# Patient Record
Sex: Male | Born: 1970 | Race: White | Hispanic: No | Marital: Married | Smoking: Former smoker
Health system: Southern US, Community
[De-identification: ages and names within clinical notes are randomized; demographics above are authoritative.]

## PROBLEM LIST (undated history)

## (undated) DIAGNOSIS — E291 Testicular hypofunction: Secondary | ICD-10-CM

## (undated) DIAGNOSIS — E785 Hyperlipidemia, unspecified: Secondary | ICD-10-CM

## (undated) DIAGNOSIS — I10 Essential (primary) hypertension: Secondary | ICD-10-CM

## (undated) DIAGNOSIS — R319 Hematuria, unspecified: Secondary | ICD-10-CM

## (undated) DIAGNOSIS — M109 Gout, unspecified: Secondary | ICD-10-CM

## (undated) DIAGNOSIS — E559 Vitamin D deficiency, unspecified: Secondary | ICD-10-CM

## (undated) HISTORY — DX: Essential (primary) hypertension: I10

## (undated) HISTORY — DX: Hyperlipidemia, unspecified: E78.5

## (undated) HISTORY — DX: Testicular hypofunction: E29.1

## (undated) HISTORY — PX: VASECTOMY: SHX75

## (undated) HISTORY — DX: Gout, unspecified: M10.9

## (undated) HISTORY — DX: Vitamin D deficiency, unspecified: E55.9

---

## 2010-03-03 ENCOUNTER — Ambulatory Visit (HOSPITAL_COMMUNITY): Admission: RE | Admit: 2010-03-03 | Discharge: 2010-03-03 | Payer: Self-pay | Admitting: Internal Medicine

## 2013-06-13 ENCOUNTER — Encounter: Payer: Self-pay | Admitting: Internal Medicine

## 2013-06-13 ENCOUNTER — Ambulatory Visit (INDEPENDENT_AMBULATORY_CARE_PROVIDER_SITE_OTHER): Payer: BC Managed Care – PPO | Admitting: Internal Medicine

## 2013-06-13 VITALS — BP 114/78 | HR 84 | Temp 98.4°F | Resp 16 | Wt 201.2 lb

## 2013-06-13 DIAGNOSIS — Z79899 Other long term (current) drug therapy: Secondary | ICD-10-CM | POA: Insufficient documentation

## 2013-06-13 DIAGNOSIS — E782 Mixed hyperlipidemia: Secondary | ICD-10-CM

## 2013-06-13 DIAGNOSIS — Z1212 Encounter for screening for malignant neoplasm of rectum: Secondary | ICD-10-CM

## 2013-06-13 DIAGNOSIS — E559 Vitamin D deficiency, unspecified: Secondary | ICD-10-CM

## 2013-06-13 DIAGNOSIS — I10 Essential (primary) hypertension: Secondary | ICD-10-CM | POA: Insufficient documentation

## 2013-06-13 DIAGNOSIS — M545 Low back pain, unspecified: Secondary | ICD-10-CM | POA: Insufficient documentation

## 2013-06-13 DIAGNOSIS — N529 Male erectile dysfunction, unspecified: Secondary | ICD-10-CM

## 2013-06-13 LAB — CBC WITH DIFFERENTIAL/PLATELET
Basophils Relative: 0 % (ref 0–1)
Eosinophils Relative: 2 % (ref 0–5)
Hemoglobin: 15.6 g/dL (ref 13.0–17.0)
Lymphocytes Relative: 30 % (ref 12–46)
Lymphs Abs: 2 10*3/uL (ref 0.7–4.0)
MCV: 89.3 fL (ref 78.0–100.0)
Monocytes Absolute: 0.6 10*3/uL (ref 0.1–1.0)
Monocytes Relative: 10 % (ref 3–12)
Neutro Abs: 3.9 10*3/uL (ref 1.7–7.7)
Platelets: 252 10*3/uL (ref 150–400)
RBC: 4.97 MIL/uL (ref 4.22–5.81)
RDW: 14.1 % (ref 11.5–15.5)
WBC: 6.7 10*3/uL (ref 4.0–10.5)

## 2013-06-13 LAB — BASIC METABOLIC PANEL WITH GFR
BUN: 17 mg/dL (ref 6–23)
Calcium: 9.6 mg/dL (ref 8.4–10.5)
Creat: 0.86 mg/dL (ref 0.50–1.35)
GFR, Est Non African American: >89 mL/min
Glucose, Bld: 82 mg/dL (ref 70–99)
Sodium: 141 mEq/L (ref 135–145)

## 2013-06-13 LAB — HEPATIC FUNCTION PANEL
ALT: 28 U/L (ref 0–53)
AST: 21 U/L (ref 0–37)
Albumin: 4.9 g/dL (ref 3.5–5.2)
Alkaline Phosphatase: 100 U/L (ref 39–117)
Bilirubin, Direct: 0.1 mg/dL (ref 0.0–0.3)
Indirect Bilirubin: 0.3 mg/dL (ref 0.0–0.9)
Total Bilirubin: 0.4 mg/dL (ref 0.3–1.2)
Total Protein: 7.3 g/dL (ref 6.0–8.3)

## 2013-06-13 LAB — LIPID PANEL
Cholesterol: 207 mg/dL — ABNORMAL HIGH (ref 0–200)
HDL: 35 mg/dL — ABNORMAL LOW (ref >39)
LDL Cholesterol: 119 mg/dL — ABNORMAL HIGH (ref 0–99)
Total CHOL/HDL Ratio: 5.9 Ratio
Triglycerides: 266 mg/dL — ABNORMAL HIGH (ref <150)
VLDL: 53 mg/dL — ABNORMAL HIGH (ref 0–40)

## 2013-06-13 LAB — MAGNESIUM: Magnesium: 2 mg/dL (ref 1.5–2.5)

## 2013-06-13 NOTE — Patient Instructions (Signed)
Continue diet & medications same as discussed.   Further disposition pending lab results.     Hypertension As your heart beats, it forces blood through your arteries. This force is your blood pressure. If the pressure is too high, it is called hypertension (HTN) or high blood pressure. HTN is dangerous because you may have it and not know it. High blood pressure may mean that your heart has to work harder to pump blood. Your arteries may be narrow or stiff. The extra work puts you at risk for heart disease, stroke, and other problems.  Blood pressure consists of two numbers, a higher number over a lower, 110/72, for example. It is stated as "110 over 72." The ideal is below 120 for the top number (systolic) and under 80 for the bottom (diastolic). Write down your blood pressure today. You should pay close attention to your blood pressure if you have certain conditions such as:  Heart failure.  Prior heart attack.  Diabetes  Chronic kidney disease.  Prior stroke.  Multiple risk factors for heart disease. To see if you have HTN, your blood pressure should be measured while you are seated with your arm held at the level of the heart. It should be measured at least twice. A one-time elevated blood pressure reading (especially in the Emergency Department) does not mean that you need treatment. There may be conditions in which the blood pressure is different between your right and left arms. It is important to see your caregiver soon for a recheck. Most people have essential hypertension which means that there is not a specific cause. This type of high blood pressure may be lowered by changing lifestyle factors such as:  Stress.  Smoking.  Lack of exercise.  Excessive weight.  Drug/tobacco/alcohol use.  Eating less salt. Most people do not have symptoms from high blood pressure until it has caused damage to the body. Effective treatment can often prevent, delay or reduce that  damage. TREATMENT  When a cause has been identified, treatment for high blood pressure is directed at the cause. There are a large number of medications to treat HTN. These fall into several categories, and your caregiver will help you select the medicines that are best for you. Medications may have side effects. You should review side effects with your caregiver. If your blood pressure stays high after you have made lifestyle changes or started on medicines,   Your medication(s) may need to be changed.  Other problems may need to be addressed.  Be certain you understand your prescriptions, and know how and when to take your medicine.  Be sure to follow up with your caregiver within the time frame advised (usually within two weeks) to have your blood pressure rechecked and to review your medications.  If you are taking more than one medicine to lower your blood pressure, make sure you know how and at what times they should be taken. Taking two medicines at the same time can result in blood pressure that is too low. SEEK IMMEDIATE MEDICAL CARE IF:  You develop a severe headache, blurred or changing vision, or confusion.  You have unusual weakness or numbness, or a faint feeling.  You have severe chest or abdominal pain, vomiting, or breathing problems. MAKE SURE YOU:   Understand these instructions.  Will watch your condition.  Will get help right away if you are not doing well or get worse. Document Released: 06/29/2005 Document Revised: 09/21/2011 Document Reviewed: 02/17/2008 [employer] Patient Information 2014  [employer]. Cholesterol Cholesterol is a white, waxy, fat-like protein needed by your body in small amounts. The liver makes all the cholesterol you need. It is carried from the liver by the blood through the blood vessels. Deposits (plaque) may build up on blood vessel walls. This makes the arteries narrower and stiffer. Plaque increases the risk for heart attack and  stroke. You cannot feel your cholesterol level even if it is very high. The only way to know is by a blood test to check your lipid (fats) levels. Once you know your cholesterol levels, you should keep a record of the test results. Work with your caregiver to to keep your levels in the desired range. WHAT THE RESULTS MEAN:  Total cholesterol is a rough measure of all the cholesterol in your blood.  LDL is the so-called bad cholesterol. This is the type that deposits cholesterol in the walls of the arteries. You want this level to be low.  HDL is the good cholesterol because it cleans the arteries and carries the LDL away. You want this level to be high.  Triglycerides are fat that the body can either burn for energy or store. High levels are closely linked to heart disease. DESIRED LEVELS:  Total cholesterol below 200.  LDL below 100 for people at risk, below 70 for very high risk.  HDL above 50 is good, above 60 is best.  Triglycerides below 150. HOW TO LOWER YOUR CHOLESTEROL:  Diet.  Choose fish or white meat chicken and [place], roasted or baked. Limit fatty cuts of red meat, fried foods, and processed meats, such as sausage and lunch meat.  Eat lots of fresh fruits and vegetables. Choose whole grains, beans, pasta, potatoes and cereals.  Use only small amounts of olive, corn or canola oils. Avoid butter, mayonnaise, shortening or palm kernel oils. Avoid foods with trans-fats.  Use skim/nonfat milk and low-fat/nonfat yogurt and cheeses. Avoid whole milk, cream, ice cream, egg yolks and cheeses. Healthy desserts include angel food cake, ginger snaps, animal crackers, hard candy, popsicles, and low-fat/nonfat frozen yogurt. Avoid pastries, cakes, pies and cookies.  Exercise.  A regular program helps decrease LDL and raises HDL.  Helps with weight control.  Do things that increase your activity level like gardening, walking, or taking the stairs.  Medication.  May be  prescribed by your caregiver to help lowering cholesterol and the risk for heart disease.  You may need medicine even if your levels are normal if you have several risk factors. HOME CARE INSTRUCTIONS   Follow your diet and exercise programs as suggested by your caregiver.  Take medications as directed.  Have blood work done when your caregiver feels it is necessary. MAKE SURE YOU:   Understand these instructions.  Will watch your condition.  Will get help right away if you are not doing well or get worse. Document Released: 03/24/2001 Document Revised: 09/21/2011 Document Reviewed: 09/14/2007 [facility] Patient Information 2014 [place], [place]. Vitamin D Deficiency Vitamin D is an important vitamin that your body needs. Having too little of it in your body is called a deficiency. A very bad deficiency can make your bones soft and can cause a condition called rickets.  Vitamin D is important to your body for different reasons, such as:   It helps your body absorb 2 minerals called calcium and phosphorus.  It helps make your bones healthy.  It may prevent some diseases, such as diabetes and multiple sclerosis.  It helps your muscles and heart.  You can get vitamin D in several ways. It is a natural part of some foods. The vitamin is also added to some dairy products and cereals. Some people take vitamin D supplements. Also, your body makes vitamin D when you are in the sun. It changes the sun's rays into a form of the vitamin that your body can use. CAUSES   Not eating enough foods that contain vitamin D.  Not getting enough sunlight.  Having certain digestive system diseases that make it hard to absorb vitamin D. These diseases include Crohn's disease, chronic pancreatitis, and cystic fibrosis.  Having a surgery in which part of the stomach or small intestine is removed.  Being obese. Fat cells pull vitamin D out of your blood. That means that obese people may not have enough  vitamin D left in their blood and in other body tissues.  Having chronic kidney or liver disease. RISK FACTORS Risk factors are things that make you more likely to develop a vitamin D deficiency. They include:  Being older.  Not being able to get outside very much.  Living in a nursing home.  Having had broken bones.  Having weak or thin bones (osteoporosis).  Having a disease or condition that changes how your body absorbs vitamin D.  Having dark skin.  Some medicines such as seizure medicines or steroids.  Being overweight or obese. SYMPTOMS Mild cases of vitamin D deficiency may not have any symptoms. If you have a very bad case, symptoms may include:  Bone pain.  Muscle pain.  Falling often.  Broken bones caused by a minor injury, due to osteoporosis. DIAGNOSIS A blood test is the best way to tell if you have a vitamin D deficiency. TREATMENT Vitamin D deficiency can be treated in different ways. Treatment for vitamin D deficiency depends on what is causing it. Options include:  Taking vitamin D supplements.  Taking a calcium supplement. Your caregiver will suggest what dose is best for you. HOME CARE INSTRUCTIONS  Take any supplements that your caregiver prescribes. Follow the directions carefully. Take only the suggested amount.  Have your blood tested 2 months after you start taking supplements.  Eat foods that contain vitamin D. Healthy choices include:  Fortified dairy products, cereals, or juices. Fortified means vitamin D has been added to the food. Check the label on the package to be sure.  Fatty fish like salmon or trout.  Eggs.  Oysters.  Do not use a tanning bed.  Keep your weight at a healthy level. Lose weight if you need to.  Keep all follow-up appointments. Your caregiver will need to perform blood tests to make sure your vitamin D deficiency is going away. SEEK MEDICAL CARE IF:  You have any questions about your treatment.  You  continue to have symptoms of vitamin D deficiency.  You have nausea or vomiting.  You are constipated.  You feel confused.  You have severe abdominal or back pain. MAKE SURE YOU:  Understand these instructions.  Will watch your condition.  Will get help right away if you are not doing well or get worse. Document Released: 09/21/2011 Document Revised: 10/24/2012 Document Reviewed: 09/21/2011 [facility] Patient Information 2014 [place], [place].

## 2013-06-13 NOTE — Progress Notes (Signed)
Patient ID: Jason Curry, male   DOB: 17-Jun-1971, 42 y.o.   MRN: [id number]   This very nice 42 yo MWM presents for 3 month follow up with Hypertension, Hyperlipidemia, Testosterone Deficiency and Vitamin D deficiency.    BP has been controlled at home. Today's BP is 114/78. Patient denies any cardiac type chest pain, palpitations, dyspnea/orthopnea/PND, dizziness, claudication, or dependent edema.   Hyperlipidemia is controlled with diet & Atorvastatin. Last cholesterol was  160, Triglycerides were 148, HDL 35 and LDL 98. Patient denies myalgias or other med SE's.    Also, the patient has history of testosterone deficiency with last testosterone level 315 - low normal in August. He denies any issues with undue fatigue, decreased libido or ED issues.   Further, Patient has history of vitamin D deficiency with last vitamin D of 52 in August. Patient supplements vitamin without any suspected side-effects. He had normal A1c of 5.0% in August.     Medication List       atorvastatin 80 MG tablet  Commonly known as:  LIPITOR  Take 80 mg by mouth every other day.     cholecalciferol 1000 UNITS tablet  Commonly known as:  VITAMIN D  Take 8,000 Units by mouth daily.     Magnesium 250 MG Tabs  Take by mouth.     vitamin C 1000 MG tablet  Take 1,000 mg by mouth daily.         No Known Allergies  PMHx:   Past Medical History  Diagnosis Date  . Hypertension   . Hyperlipidemia    Vitamin D Deficiency   . Hypogonadism male     FHx:    Reviewed / unchanged  SHx:    Reviewed / unchanged  Systems Review: Constitutional: Denies fever, chills, wt changes, headaches, insomnia, fatigue, night sweats, change in appetite. Eyes: Denies redness, blurred vision, diplopia, discharge, itchy, watery eyes.  ENT: Denies discharge, congestion, post nasal drip, epistaxis, sore throat, earache, hearing loss, dental pain, tinnitus, vertigo, sinus pain, snoring.  CV: Denies chest pain, palpitations,  irregular heartbeat, syncope, dyspnea, diaphoresis, orthopnea, PND, claudication, edema. Respiratory: denies cough, dyspnea, DOE, pleurisy, hoarseness, laryngitis, wheezing.  Gastrointestinal: Denies dysphagia, odynophagia, heartburn, reflux, water brash, abdominal pain or cramps, nausea, vomiting, bloating, diarrhea, constipation, hematemesis, melena, hematochezia,  Hemorrhoids. Genitourinary: Denies dysuria, frequency, urgency, nocturia, hesitancy, discharge, hematuria, flank pain. Musculoskeletal: Denies arthralgias, myalgias, stiffness, jt. swelling, pain, limp, strain/sprain.  Skin: Denies pruritus, rash, hives, warts, acne, eczema, change in skin lesion(s). Neuro: No weakness, tremor, incoordination, spasms, paresthesia, or pain. Psychiatric: Denies confusion, memory loss, or sensory loss. Endo: Denies change in weight, skin, hair change.  Heme/Lymph: No excessive bleeding, bruising, orenlarged lymph nodes.  Filed Vitals:   06/13/13 1627  BP: 114/78  Pulse: 84  Temp: 98.4 F (36.9 C)  Resp: 16     On Exam: Appears well nourished - in no distress. Eyes: PERRLA, EOMs, conjunctiva no swelling or erythema. Sinuses: No frontal/maxillary tenderness ENT/Mouth: EAC's clear, TM's nl w/o erythema, bulging. Nares clear w/o erythema, swelling, exudates. Oropharynx clear without erythema or exudates. Oral hygiene is good. Tongue normal, non obstructing. Hearing intact.  Neck: Supple. Thyroid nl. Car 2+/2+ without bruits, nodes or JVD. Chest: Respirations nl with BS clear & equal w/o rales, rhonchi, wheezing or stridor.  Cor: Heart sounds normal w/ regular rate and rhythm without sig. murmurs, gallops, clicks, or rubs. Peripheral pulses normal and equal  without edema.  Abdomen: Soft & bowel sounds normal. Non-tender  w/o guarding, rebound, hernias, masses, or organomegaly.  Lymphatics: Unremarkable.  Musculoskeletal: Full ROM all peripheral extremities, joint stability, 5/5 strength, and  normal gait.  Skin: Warm, dry without exposed rashes, lesions, ecchymosis apparent.  Neuro: Cranial nerves intact, reflexes equal bilaterally. Sensory-motor testing grossly intact. Tendon reflexes grossly intact.  Pysch: Alert & oriented x 3. Insight and judgement nl & appropriate. No ideations.  Assessment and Plan:  1. Hypertension - Continue monitor blood pressure at home. Continue diet/meds same.  2. Hyperlipidemia - Continue diet/meds, exercise,& lifestyle modifications. Continue monitor periodic cholesterol/liver & renal functions   3. Testosterone Deficiency, Hx/o.  4. Vitamin D Deficiency - Continue supplementation.  Further disposition pending results of labs.

## 2013-06-14 LAB — VITAMIN D 25 HYDROXY (VIT D DEFICIENCY, FRACTURES): Vit D, 25-Hydroxy: 78 ng/mL (ref 30–89)

## 2013-09-14 ENCOUNTER — Ambulatory Visit (INDEPENDENT_AMBULATORY_CARE_PROVIDER_SITE_OTHER): Payer: BC Managed Care – PPO | Admitting: Physician Assistant

## 2013-09-14 ENCOUNTER — Encounter: Payer: Self-pay | Admitting: Physician Assistant

## 2013-09-14 VITALS — BP 110/68 | HR 72 | Temp 99.0°F | Resp 16 | Wt 197.0 lb

## 2013-09-14 DIAGNOSIS — Z79899 Other long term (current) drug therapy: Secondary | ICD-10-CM

## 2013-09-14 DIAGNOSIS — E559 Vitamin D deficiency, unspecified: Secondary | ICD-10-CM

## 2013-09-14 DIAGNOSIS — I10 Essential (primary) hypertension: Secondary | ICD-10-CM

## 2013-09-14 DIAGNOSIS — E782 Mixed hyperlipidemia: Secondary | ICD-10-CM

## 2013-09-14 LAB — CBC WITH DIFFERENTIAL/PLATELET
BASOS PCT: 0 % (ref 0–1)
Basophils Absolute: 0 10*3/uL (ref 0.0–0.1)
EOS PCT: 2 % (ref 0–5)
Eosinophils Absolute: 0.1 10*3/uL (ref 0.0–0.7)
HCT: 45.8 % (ref 39.0–52.0)
HEMOGLOBIN: 16.1 g/dL (ref 13.0–17.0)
LYMPHS PCT: 19 % (ref 12–46)
Lymphs Abs: 1.3 10*3/uL (ref 0.7–4.0)
MCH: 32.1 pg (ref 26.0–34.0)
MCHC: 35.2 g/dL (ref 30.0–36.0)
MCV: 91.2 fL (ref 78.0–100.0)
MONOS PCT: 10 % (ref 3–12)
Monocytes Absolute: 0.7 10*3/uL (ref 0.1–1.0)
NEUTROS ABS: 4.6 10*3/uL (ref 1.7–7.7)
NEUTROS PCT: 69 % (ref 43–77)
Platelets: 222 10*3/uL (ref 150–400)
RBC: 5.02 MIL/uL (ref 4.22–5.81)
RDW: 14.2 % (ref 11.5–15.5)
WBC: 6.6 10*3/uL (ref 4.0–10.5)

## 2013-09-14 NOTE — Patient Instructions (Signed)
Cholesterol Cholesterol is a white, waxy, fat-like protein needed by your body in small amounts. The liver makes all the cholesterol you need. It is carried from the liver by the blood through the blood vessels. Deposits (plaque) may build up on blood vessel walls. This makes the arteries narrower and stiffer. Plaque increases the risk for heart attack and stroke. You cannot feel your cholesterol level even if it is very high. The only way to know is by a blood test to check your lipid (fats) levels. Once you know your cholesterol levels, you should keep a record of the test results. Work with your caregiver to to keep your levels in the desired range. WHAT THE RESULTS MEAN:  Total cholesterol is a rough measure of all the cholesterol in your blood.  LDL is the so-called bad cholesterol. This is the type that deposits cholesterol in the walls of the arteries. You want this level to be low.  HDL is the good cholesterol because it cleans the arteries and carries the LDL away. You want this level to be high.  Triglycerides are fat that the body can either burn for energy or store. High levels are closely linked to heart disease. DESIRED LEVELS:  Total cholesterol below 200.  LDL below 100 for people at risk, below 70 for very high risk.  HDL above 50 is good, above 60 is best.  Triglycerides below 150. HOW TO LOWER YOUR CHOLESTEROL:  Diet.  Choose fish or white meat chicken and [place], roasted or baked. Limit fatty cuts of red meat, fried foods, and processed meats, such as sausage and lunch meat.  Eat lots of fresh fruits and vegetables. Choose whole grains, beans, pasta, potatoes and cereals.  Use only small amounts of olive, corn or canola oils. Avoid butter, mayonnaise, shortening or palm kernel oils. Avoid foods with trans-fats.  Use skim/nonfat milk and low-fat/nonfat yogurt and cheeses. Avoid whole milk, cream, ice cream, egg yolks and cheeses. Healthy desserts include angel food  cake, ginger snaps, animal crackers, hard candy, popsicles, and low-fat/nonfat frozen yogurt. Avoid pastries, cakes, pies and cookies.  Exercise.  A regular program helps decrease LDL and raises HDL.  Helps with weight control.  Do things that increase your activity level like gardening, walking, or taking the stairs.  Medication.  May be prescribed by your caregiver to help lowering cholesterol and the risk for heart disease.  You may need medicine even if your levels are normal if you have several risk factors. HOME CARE INSTRUCTIONS   Follow your diet and exercise programs as suggested by your caregiver.  Take medications as directed.  Have blood work done when your caregiver feels it is necessary. MAKE SURE YOU:   Understand these instructions.  Will watch your condition.  Will get help right away if you are not doing well or get worse. Document Released: 03/24/2001 Document Revised: 09/21/2011 Document Reviewed: 04/12/2013 [facility] Patient Information 2014 [place].  Your HDL is low. The goal is greater than 40 for men and greater than 50 for women.  H-density lipoprotein cholesterol is considered to be beneficial because it removes excess cholesterol and disposes of it. Hence HDL cholesterol is often termed "good" cholesterol. Ways to increase your HDL is to add more veggies and walk more. The American Heart Association says that 150 mins a week of walking or cardio is good for your heart and will improve your HDL.  Triglycerides are simple fats in blood that are converted into a storage form.  I recommend you avoid fried/greasy foods, sweets/candy, white rice , white potatoes,  anything made from white flour, sweet tea, soda, fruit juices and avoid alcohol in excess. Sweet potatoes, brown/wild rice/Quinoa, Vegetarian, spinach, or wheat pasta, Multi-grain bread - like multi-grain flat bread or sandwich thins are okay.  Please consider getting a pedometer and tracking  steps while at work. Goal is 10,000 steps at least in a day.

## 2013-09-14 NOTE — Progress Notes (Signed)
HPI 43 y.o. male  presents for 3 month follow up with hypertension, hyperlipidemia, prediabetes and vitamin D. His blood pressure has been controlled at home, today their BP is BP: 110/68 mmHg He does not workout but he works 12 hour shifts in [employer] and states very active. He denies chest pain, shortness of breath, dizziness.  He is on cholesterol medication, he ran out and just started to take it regularly again 1-2 weeks ago, and denies myalgias. His cholesterol is at goal. The cholesterol last visit was:   Lab Results  Component Value Date   CHOL 207* 06/13/2013   HDL 35* 06/13/2013   LDLCALC 119* 06/13/2013   TRIG 266* 06/13/2013   CHOLHDL 5.9 06/13/2013  A1C 5.0, was treated for hypogonadism but stopped and states that he is feeling better.   Current Medications:  Current Outpatient Prescriptions on File Prior to Visit  Medication Sig Dispense Refill  . Ascorbic Acid (VITAMIN C) 1000 MG tablet Take 1,000 mg by mouth daily.      Marland Kitchen. atorvastatin (LIPITOR) 80 MG tablet Take 80 mg by mouth every other day.      . cholecalciferol (VITAMIN D) 1000 UNITS tablet Take 8,000 Units by mouth daily.      . Magnesium 250 MG TABS Take by mouth.       No current facility-administered medications on file prior to visit.   Medical History:  Past Medical History  Diagnosis Date  . Hypertension   . Hyperlipidemia   . Hypogonadism male    Allergies: No Known Allergies   Review of Systems: [X]  = complains of  [ ]  = denies  General: Fatigue [ ]  Fever [ ]  Chills [ ]  Weakness [ ]   Insomnia [ ]  Eyes: Redness [ ]  Blurred vision [ ]  Diplopia [ ]   ENT: Congestion [ ]  Sinus Pain [ ]  Post Nasal Drip [ ]  Sore Throat [ ]  Earache [ ]   Cardiac: Chest pain/pressure [ ]  SOB [ ]  Orthopnea [ ]   Palpitations [ ]   Paroxysmal nocturnal dyspnea[ ]  Claudication [ ]  Edema [ ]   Pulmonary: Cough [ ]  Wheezing[ ]   SOB [ ]   Snoring [ ]   GI: Nausea [ ]  Vomiting[ ]  Dysphagia[ ]  Heartburn[ ]  Abdominal pain [ ]  Constipation [  ]; Diarrhea [ ] ; BRBPR [ ]  Melena[ ]  GU: Hematuria[ ]  Dysuria [ ]  Nocturia[ ]  Urgency [ ]   Hesitancy [ ]  Discharge [ ]  Neuro: Headaches[ ]  Vertigo[ ]  Paresthesias[ ]  Spasm [ ]  Speech changes [ ]  Incoordination [ ]   Ortho: Arthritis [ ]  Joint pain [ ]  Muscle pain [ ]  Joint swelling [ ]  Back Pain [ ]  Skin:  Rash [ ]   Pruritis [ ]  Change in skin lesion [ ]   Psych: Depression[ ]  Anxiety[ ]  Confusion [ ]  Memory loss [ ]   Heme/Lypmh: Bleeding [ ]  Bruising [ ]  Enlarged lymph nodes [ ]   Endocrine: Visual blurring [ ]  Paresthesia [ ]  Polyuria [ ]  Polydypsea [ ]    Heat/cold intolerance [ ]  Hypoglycemia [ ]   Family history- Review and unchanged Social history- Review and unchanged Physical Exam: Filed Vitals:   09/14/13 1548  BP: 110/68  Pulse: 72  Temp: 99 F (37.2 C)  Resp: 16   Wt Readings from Last 3 Encounters:  09/14/13 197 lb (89.359 kg)  06/13/13 201 lb 3.2 oz (91.264 kg)   General Appearance: Well nourished, in no apparent distress. Eyes: PERRLA, EOMs, conjunctiva no swelling or erythema Sinuses: No Frontal/maxillary tenderness ENT/Mouth:  Ext aud canals clear, TMs without erythema, bulging. No erythema, swelling, or exudate on post pharynx.  Tonsils not swollen or erythematous. Hearing normal.  Neck: Supple, thyroid normal.  Respiratory: Respiratory effort normal, BS equal bilaterally without rales, rhonchi, wheezing or stridor.  Cardio: RRR with no MRGs. Brisk peripheral pulses without edema.  Abdomen: Soft, + BS.  Non tender, no guarding, rebound, hernias, masses. Lymphatics: Non tender without lymphadenopathy.  Musculoskeletal: Full ROM, 5/5 strength, normal gait.  Skin: Warm, dry without rashes, lesions, ecchymosis.  Neuro: Cranial nerves intact. Normal muscle tone, no cerebellar symptoms. Sensation intact.  Psych: Awake and oriented X 3, normal affect, Insight and Judgment appropriate.   Assessment and Plan:  Hypertension: Continue medication, monitor blood pressure at  home. Continue DASH diet. Cholesterol: Continue diet and exercise. Check cholesterol.  Vitamin D Def- check level and continue medications.  Hypogonadism- no symptoms will not check  Continue diet and meds as discussed. Further disposition pending results of labs.  Quentin Mulling 4:00 PM

## 2013-09-15 LAB — HEPATIC FUNCTION PANEL
ALK PHOS: 100 U/L (ref 39–117)
ALT: 24 U/L (ref 0–53)
AST: 21 U/L (ref 0–37)
Albumin: 4.9 g/dL (ref 3.5–5.2)
BILIRUBIN DIRECT: 0.2 mg/dL (ref 0.0–0.3)
BILIRUBIN TOTAL: 0.8 mg/dL (ref 0.2–1.2)
Indirect Bilirubin: 0.6 mg/dL (ref 0.2–1.2)
Total Protein: 7.4 g/dL (ref 6.0–8.3)

## 2013-09-15 LAB — BASIC METABOLIC PANEL WITH GFR
BUN: 14 mg/dL (ref 6–23)
CO2: 28 mEq/L (ref 19–32)
CREATININE: 0.91 mg/dL (ref 0.50–1.35)
Calcium: 9.8 mg/dL (ref 8.4–10.5)
Chloride: 101 mEq/L (ref 96–112)
GFR, Est African American: >89 mL/min
GFR, Est Non African American: >89 mL/min
GLUCOSE: 77 mg/dL (ref 70–99)
POTASSIUM: 4.9 meq/L (ref 3.5–5.3)
Sodium: 140 mEq/L (ref 135–145)

## 2013-09-15 LAB — VITAMIN D 25 HYDROXY (VIT D DEFICIENCY, FRACTURES): VIT D 25 HYDROXY: 80 ng/mL (ref 30–89)

## 2013-09-15 LAB — TSH: TSH: 2.617 u[IU]/mL (ref 0.350–4.500)

## 2013-09-15 LAB — LIPID PANEL
Cholesterol: 179 mg/dL (ref 0–200)
HDL: 35 mg/dL — AB (ref >39)
LDL CALC: 124 mg/dL — AB (ref 0–99)
Total CHOL/HDL Ratio: 5.1 Ratio
Triglycerides: 102 mg/dL (ref <150)
VLDL: 20 mg/dL (ref 0–40)

## 2013-09-15 LAB — MAGNESIUM: MAGNESIUM: 2 mg/dL (ref 1.5–2.5)

## 2013-12-19 ENCOUNTER — Ambulatory Visit: Payer: Self-pay | Admitting: Physician Assistant

## 2014-03-13 ENCOUNTER — Encounter: Payer: Self-pay | Admitting: Emergency Medicine

## 2014-04-05 ENCOUNTER — Ambulatory Visit (INDEPENDENT_AMBULATORY_CARE_PROVIDER_SITE_OTHER): Payer: BC Managed Care – PPO | Admitting: Emergency Medicine

## 2014-04-05 ENCOUNTER — Encounter: Payer: Self-pay | Admitting: Emergency Medicine

## 2014-04-05 ENCOUNTER — Other Ambulatory Visit: Payer: Self-pay | Admitting: Emergency Medicine

## 2014-04-05 VITALS — BP 122/88 | HR 82 | Temp 98.2°F | Resp 18 | Ht 66.0 in | Wt 199.6 lb

## 2014-04-05 DIAGNOSIS — Z1212 Encounter for screening for malignant neoplasm of rectum: Secondary | ICD-10-CM

## 2014-04-05 DIAGNOSIS — Z111 Encounter for screening for respiratory tuberculosis: Secondary | ICD-10-CM

## 2014-04-05 DIAGNOSIS — R5383 Other fatigue: Secondary | ICD-10-CM

## 2014-04-05 DIAGNOSIS — Z125 Encounter for screening for malignant neoplasm of prostate: Secondary | ICD-10-CM

## 2014-04-05 DIAGNOSIS — R5381 Other malaise: Secondary | ICD-10-CM

## 2014-04-05 DIAGNOSIS — R0602 Shortness of breath: Secondary | ICD-10-CM

## 2014-04-05 DIAGNOSIS — E782 Mixed hyperlipidemia: Secondary | ICD-10-CM

## 2014-04-05 DIAGNOSIS — Z0000 Encounter for general adult medical examination without abnormal findings: Secondary | ICD-10-CM

## 2014-04-05 NOTE — Patient Instructions (Signed)
Fatigue Fatigue is a feeling of tiredness, lack of energy, lack of motivation, or feeling tired all the time. Having enough rest, good nutrition, and reducing stress will normally reduce fatigue. Consult your caregiver if it persists. The nature of your fatigue will help your caregiver to find out its cause. The treatment is based on the cause.  CAUSES  There are many causes for fatigue. Most of the time, fatigue can be traced to one or more of your habits or routines. Most causes fit into one or more of three general areas. They are: Lifestyle problems  Sleep disturbances.  Overwork.  Physical exertion.  Unhealthy habits.  Poor eating habits or eating disorders.  Alcohol and/or drug use .  Lack of proper nutrition (malnutrition). Psychological problems  Stress and/or anxiety problems.  Depression.  Grief.  Boredom. Medical Problems or Conditions  Anemia.  Pregnancy.  Thyroid gland problems.  Recovery from major surgery.  Continuous pain.  Emphysema or asthma that is not well controlled  Allergic conditions.  Diabetes.  Infections (such as mononucleosis).  Obesity.  Sleep disorders, such as sleep apnea.  Heart failure or other heart-related problems.  Cancer.  Kidney disease.  Liver disease.  Effects of certain medicines such as antihistamines, cough and cold remedies, prescription pain medicines, heart and blood pressure medicines, drugs used for treatment of cancer, and some antidepressants. SYMPTOMS  The symptoms of fatigue include:   Lack of energy.  Lack of drive (motivation).  Drowsiness.  Feeling of indifference to the surroundings. DIAGNOSIS  The details of how you feel help guide your caregiver in finding out what is causing the fatigue. You will be asked about your present and past health condition. It is important to review all medicines that you take, including prescription and non-prescription items. A thorough exam will be done.  You will be questioned about your feelings, habits, and normal lifestyle. Your caregiver may suggest blood tests, urine tests, or other tests to look for common medical causes of fatigue.  TREATMENT  Fatigue is treated by correcting the underlying cause. For example, if you have continuous pain or depression, treating these causes will improve how you feel. Similarly, adjusting the dose of certain medicines will help in reducing fatigue.  HOME CARE INSTRUCTIONS   Try to get the required amount of good sleep every night.  Eat a healthy and nutritious diet, and drink enough water throughout the day.  Practice ways of relaxing (including yoga or meditation).  Exercise regularly.  Make plans to change situations that cause stress. Act on those plans so that stresses decrease over time. Keep your work and personal routine reasonable.  Avoid street drugs and minimize use of alcohol.  Start taking a daily multivitamin after consulting your caregiver. SEEK MEDICAL CARE IF:   You have persistent tiredness, which cannot be accounted for.  You have fever.  You have unintentional weight loss.  You have headaches.  You have disturbed sleep throughout the night.  You are feeling sad.  You have constipation.  You have dry skin.  You have gained weight.  You are taking any new or different medicines that you suspect are causing fatigue.  You are unable to sleep at night.  You develop any unusual swelling of your legs or other parts of your body. SEEK IMMEDIATE MEDICAL CARE IF:   You are feeling confused.  Your vision is blurred.  You feel faint or pass out.  You develop severe headache.  You develop severe abdominal, pelvic, or   back pain.  You develop chest pain, shortness of breath, or an irregular or fast heartbeat.  You are unable to pass a normal amount of urine.  You develop abnormal bleeding such as bleeding from the rectum or you vomit blood.  You have thoughts  about harming yourself or committing suicide.  You are worried that you might harm someone else. MAKE SURE YOU:   Understand these instructions.  Will watch your condition.  Will get help right away if you are not doing well or get worse. Document Released: 04/26/2007 Document Revised: 09/21/2011 Document Reviewed: 10/31/2013 [facility] Patient Information 2015 [place], [place]. This information is not intended to replace advice given to you by your health care provider. Make sure you discuss any questions you have with your health care provider. Gluten-Free Diet for Celiac Disease Gluten is a protein found in wheat, rye, barley, and triticale (a cross between wheat and rye) grains. People with celiac disease need to have a gluten-free diet. With celiac disease, gluten interferes with the absorption of food and may also cause intestinal injury.  Strict compliance is important even during symptom-free periods. This means eliminating all foods with gluten from your diet permanently. This requires some significant changes but is very manageable. WHAT DO I NEED TO KNOW ABOUT A GLUTEN-FREE DIET?  Look for items labeled with "GF." Looking for GF will make it easier to identify products that are safe to eat.  Read all labels. Gluten may have been added as a minor ingredient where least expected, such as in shredded cheeses or ice creams. Always check food labels and investigate questionable ingredients. Talk to your dietitian or health care provider if you have questions about certain foods or need help finding GF foods.  Check when in doubt. If you are not sure whether an ingredient contains gluten, check with the manufacturer. Note that some manufacturers may change ingredients without notice. Always read labels.   Know how food is prepared. Since flour and cereal products are often used in the preparation of foods, it is important to be aware of the methods of preparation used, as well as the  ingredients in the foods themselves. This is especially true when you are dining out. Ask restaurants if they have a gluten-free menu.  Watch for cross-contamination. Cross-contamination occurs when gluten-free foods come into contact with foods that contain gluten. It often happens during the manufacturing process. Always check the ingredient list and for warnings on packages, such as "may contain gluten."  Eat a balanced diet. It is important to still get enough fiber, iron, and B vitamins in your diet. Look for enriched whole grain gluten-free products and continue to eat a well-balanced diet of the important non-grain items, such as vegetables, fruit, lean proteins, legumes, and dairy.  Consider taking a gluten-free multivitamin and mineral supplement. Discuss this with your health care provider. WHAT KEY WORDS HELP IDENTIFY GLUTEN? Know key words to help identify gluten. A dietitian can help you identify possible harmful ingredients in the foods you normally eat. Words to check for on food labels include:   Flour, enriched flour, bromated flour, white flour, durum flour, graham flour, phosphated flour, self-rising flour, semolina, or farina.  Starch, dextrin, modified food starch, or cereal.  Thickening, fillers, or emulsifiers.  Any kind of malt flavoring, extract, or syrup (malt is made from barley and includes malt vinegar, malted milk, and malted beverages).  Hydrolyzed vegetable protein. WHAT FOODS CAN I EAT? Below is a list of common foods that  are allowed with a gluten-free diet.  Grains Products made from the following flours or grains:amaranth,bean flours, 100% buckwheat flour, corn, millet, nut flours or meals, GF oats, quinoa, rice, sorghum, teff, any all-purpose 100% GF flour mix, rice wafers, pure cornmeal tortillas, popcorn, some crackers, some chips, and hot cereals made from cornmeal. Ask your dietitian which specific hot and cold cereals are allowed. Hominy, rice or wild  rice, and special GF pasta. Some Asian rice noodles or bean noodles. Arrowroot starch, corn bran, corn flour, corn germ, cornmeal, corn starch, potato flour, potato starch flour, and rice bran. Rice flours: plain, brown, and sweet. Rice polish, soy flour, tapioca starch. Vegetables All plain, fresh, frozen, or canned vegetables.  Fruits All fresh, frozen, canned, dried fruits, and fruit juices.  Meats and Other Protein Foods Meat, fish, poultry, or eggs prepared without added wheat, rye, barley, or triticale. Some luncheon meat and some frankfurters. Pure meat. All aged cheese, most processed cheese products, some cottage cheese, and some cream cheese. Dried beans, dried peas, and lentils.  Dairy Milk and yogurt made with allowed ingredients.  Beverages Coffee (regular or decaffeinated), tea, herbal tea (read label to be sure that no wheat flour has been added). Carbonated beverages and some root beers. Wine, sake, and distilled spirits, such as gin, vodka, and whiskey. GF beers and GF ciders.  Sweetsand Desserts Sugar, honey, some syrups, molasses, jelly, jam, plain hard candy, marshmallows, gumdrops, homemade candies free of wheat, rye, barley, or triticale. Coconut. Custard, some pudding mixes, and homemade puddings from cornstarch, rice, and tapioca. Gelatin desserts, sorbets, frozen ice pops, and sherbet. Cake, cookies, and other desserts prepared with allowed flours. Some commercial ice creams. Ask your dietitian about specific brands of dessert that are allowed.  Fats and Oils Butter, margarine, vegetable oil, sour cream not containing modified food starch, whipping cream, shortening, lard, cream, and some mayonnaise. Some commercial salad dressings. Peanut butter.  Other Homemade broth and soups made with allowed ingredients; some canned or frozen soups. Any other combination or prepared foods that do not contain gluten. Monosodium glutamate (MSG). Cider, rice, and wine vinegar.  Baking soda and baking powder. Certain soy sauces (Tamari). Ask your dietitian about specific brands that are allowed. Nuts, coconut, chocolate, and pure cocoa powder. Salt, pepper, herbs, spices, extracts, and food colorings. The items listed above may not be a complete list of allowed foods or beverages. Contact your dietitian for more options.  WHAT FOODS CAN I NOT EAT? Below is a list of common foods that are not allowed with a gluten-free diet.  Grains Barley, bran, bulgur, cracked wheat, graham, malt, matzo, wheat germ, and all wheat and rye cereals including spelt and kamut. Avoid cereals containing malt as a flavoring, such as rice cereal. Also avoid regular noodles, spaghetti, macaroni, and most packaged rice mixes, and all others containing wheat, rye, barley, or triticale.  Vegetables Most creamed vegetables, most vegetables canned in sauces, and any vegetables prepared with wheat, rye, barley, or triticale.  Fruits Thickened or prepared fruits and some pie fillings.  Meats and Other Protein Sources Any meat or meat alternative containing wheat, rye, barley, or gluten stabilizers (such as some hot dogs, salami, cold cuts, or sausage). Bread-containing products, such as Swiss steak, croquettes, and meatloaf. Most tuna canned in vegetable broth, [place] with hydrolyzed vegetable protein (HVP) injected as part of the basting, and any cheese product containing oat gum as an ingredient. Seitan. Imitation fish. Dairy Commercial chocolate milk, which may have cereal added,  and malted milk. Beverages Certain cereal beverages. Beer and ciders (unless GF), ale, malted milk, and some root beers. Sweetsand Desserts Commercial candies containing wheat, rye, barley, or triticale. Certain toffees are dusted with wheat flour. Chocolate-coated nuts, which are often rolled in flour. Cakes, cookies, doughnuts, and pastries that are prepared with wheat, barley, rye, or triticale flour. Some commercial  ice creams, ice cream flavors which contain cookies, crumbs, or cheesecake. Ice cream cones. Commercially prepared mixes for cakes, cookies, and other desserts unless marked GF. Bread pudding and other puddings thickened with flour. Fats and Oils Some commercial salad dressings and sour cream containing modified food starch.  Condiments Some curry powder, some dry seasoning mixes, some gravy extracts, some meat sauces, some ketchup, some prepared mustard, horseradish. Other All soups containing wheat, rye, barley, or triticale flour. Bouillon and bouillon cubes that contain HVP. Combination or prepared foods that contain gluten. Some soy sauce, some chip dips, and some chewing gum. Yeast extract (contains barley). Caramel color (may contain malt). The items listed above may not be a complete list of foods and beverages to avoid. Contact your dietitian for more information. Document Released: 06/29/2005 Document Revised: 11/13/2013 Document Reviewed: 05/03/2013 [facility] Patient Information 2015 [place], [place]. This information is not intended to replace advice given to you by your health care provider. Make sure you discuss any questions you have with your health care provider.

## 2014-04-05 NOTE — Progress Notes (Signed)
Subjective:    Patient ID: Jason Curry, male    DOB: 1971-07-06, 43 y.o.   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 Deficient. He notes more easily SOB at work with climbing ladder/ <2 flights of steps. He is not exercising. He is not eating healthy all of the time. He denies CP. He is working 7 days a week and is not resting well. He notes he is more fatigued as well.    Last Abnormal LDL 124    Medication List       This list is accurate as of: 04/05/14  3:55 PM.  Always use your most recent med list.               atorvastatin 80 MG tablet  Commonly known as:  LIPITOR  Take 80 mg by mouth every other day.     cholecalciferol 1000 UNITS tablet  Commonly known as:  VITAMIN D  Take 8,000 Units by mouth daily.     Magnesium 250 MG Tabs  Take by mouth.     vitamin C 1000 MG tablet  Take 1,000 mg by mouth daily.       No Known Allergies Past Medical History  Diagnosis Date  . Hypertension   . Hyperlipidemia   . Hypogonadism male    No past surgical history on file.   History  Substance Use Topics  . Smoking status: Former Smoker    Quit date: 06/13/1993  . Smokeless tobacco: Current User    Types: Chew  . Alcohol Use: 0.5 oz/week    1 drink(s) per week   Family History  Problem Relation Age of Onset  . Cancer Mother     thyroid  . Heart disease Father   . Hypertension Father   . Hyperlipidemia Father     MAINTENANCE: Colonoscopy:n/a [facility] Dentist:overdue  IMMUNIZATIONS: Td:2008 Pneumovax:2010 Zostavax:n/a Influenza:2014 @ work  Patient Care Team: Lucky Cowboy, MD as PCP - General (Internal Medicine)  Ward Givens, (Eye)  Review of Systems  Constitutional: Positive for fatigue.  Respiratory: Positive for shortness of breath. Negative for chest tightness.   Cardiovascular: Negative for chest pain.  Psychiatric/Behavioral: Positive for sleep disturbance.    All other systems reviewed and are negative.  BP 122/88  Pulse 82  Temp(Src) 98.2 F (36.8 C)  Resp 18  Ht  (1.676 m)  Wt 199 lb 9.6 oz (90.538 kg)  BMI 32.23 kg/m2     Objective:   Physical Exam  Nursing note and vitals reviewed. Constitutional: He is oriented to person, place, and time. He appears well-developed and well-nourished.  overweight  HENT:  Head: Normocephalic and atraumatic.  Right Ear: External ear normal.  Left Ear: External ear normal.  Nose: Nose normal.  Mouth/Throat: Oropharynx is clear and moist. No oropharyngeal exudate.  Large tongue partially obstructs airway. Large neck  Eyes: Conjunctivae and EOM are normal. Pupils are equal, round, and reactive to light. Right eye exhibits no discharge. Left eye exhibits no discharge. No scleral icterus.  Neck: Normal range of motion. Neck supple. No JVD present. No tracheal deviation present. No thyromegaly present.  Cardiovascular: Normal rate, regular rhythm, normal heart sounds and intact distal pulses.   Pulmonary/Chest: Effort normal and breath sounds normal.  Abdominal: Soft. Bowel sounds are normal. He exhibits no distension and no mass. There is no tenderness. There is no rebound and no guarding.  Genitourinary:  Def to lab  Musculoskeletal: Normal range of motion. He exhibits no edema and no tenderness.  Lymphadenopathy:    He has no cervical adenopathy.  Neurological: He is alert and oriented to person, place, and time. He has normal reflexes. No cranial nerve deficit. He exhibits normal muscle tone. Coordination normal.  Skin: Skin is warm and dry. No rash noted. No erythema. No pallor.     Psychiatric: He has a normal mood and affect. His behavior is normal. Judgment and thought content normal.    EKG NSCSPT WNL      Assessment & Plan:  1. CPE- Update screening labs/ History/ Immunizations/ Testing as needed. Advised healthy diet, QD exercise, increase H20 and continue RX/ Vitamins AD.  2.  SOB/ Fatigue- check labs, increase activity and H2O, CXR, refer for sleep study, w/c if SX increase or ER. May need cardiology referral if symptoms continue and all tests NEG.   3.  3 month F/U for HTN, Cholesterol, Pre-Dm, D. Deficient. Needs healthy diet, cardio QD and obtain healthy weight. Check Labs, Check BP if >130/80 call office   4.  Irreg Nevi- monitor for any change, call if occurs for removal

## 2014-04-06 LAB — CBC WITH DIFFERENTIAL/PLATELET
Basophils Absolute: 0 10*3/uL (ref 0.0–0.1)
Basophils Relative: 0 % (ref 0–1)
EOS ABS: 0.1 10*3/uL (ref 0.0–0.7)
Eosinophils Relative: 2 % (ref 0–5)
HCT: 45.5 % (ref 39.0–52.0)
HEMOGLOBIN: 15.9 g/dL (ref 13.0–17.0)
LYMPHS ABS: 2 10*3/uL (ref 0.7–4.0)
LYMPHS PCT: 29 % (ref 12–46)
MCH: 32.2 pg (ref 26.0–34.0)
MCHC: 34.9 g/dL (ref 30.0–36.0)
MCV: 92.1 fL (ref 78.0–100.0)
MONO ABS: 0.5 10*3/uL (ref 0.1–1.0)
Monocytes Relative: 8 % (ref 3–12)
NEUTROS PCT: 61 % (ref 43–77)
Neutro Abs: 4.1 10*3/uL (ref 1.7–7.7)
Platelets: 239 10*3/uL (ref 150–400)
RBC: 4.94 MIL/uL (ref 4.22–5.81)
RDW: 14.6 % (ref 11.5–15.5)
WBC: 6.8 10*3/uL (ref 4.0–10.5)

## 2014-04-06 LAB — BASIC METABOLIC PANEL WITH GFR
BUN: 13 mg/dL (ref 6–23)
CALCIUM: 9.6 mg/dL (ref 8.4–10.5)
CHLORIDE: 102 meq/L (ref 96–112)
CO2: 27 meq/L (ref 19–32)
CREATININE: 1.04 mg/dL (ref 0.50–1.35)
GFR, Est Non African American: 88 mL/min
Glucose, Bld: 83 mg/dL (ref 70–99)
POTASSIUM: 4.2 meq/L (ref 3.5–5.3)
SODIUM: 140 meq/L (ref 135–145)

## 2014-04-06 LAB — HEPATIC FUNCTION PANEL
ALBUMIN: 4.9 g/dL (ref 3.5–5.2)
ALK PHOS: 88 U/L (ref 39–117)
ALT: 15 U/L (ref 0–53)
AST: 17 U/L (ref 0–37)
Bilirubin, Direct: 0.1 mg/dL (ref 0.0–0.3)
Indirect Bilirubin: 0.5 mg/dL (ref 0.2–1.2)
Total Bilirubin: 0.6 mg/dL (ref 0.2–1.2)
Total Protein: 7.3 g/dL (ref 6.0–8.3)

## 2014-04-06 LAB — LIPID PANEL
Cholesterol: 203 mg/dL — ABNORMAL HIGH (ref 0–200)
HDL: 37 mg/dL — AB (ref >39)
LDL CALC: 139 mg/dL — AB (ref 0–99)
Total CHOL/HDL Ratio: 5.5 Ratio
Triglycerides: 136 mg/dL (ref <150)
VLDL: 27 mg/dL (ref 0–40)

## 2014-04-06 LAB — TSH: TSH: 2.967 u[IU]/mL (ref 0.350–4.500)

## 2014-04-06 LAB — URINALYSIS, ROUTINE W REFLEX MICROSCOPIC
BILIRUBIN URINE: NEGATIVE
Glucose, UA: NEGATIVE mg/dL
KETONES UR: NEGATIVE mg/dL
LEUKOCYTES UA: NEGATIVE
Nitrite: NEGATIVE
Protein, ur: NEGATIVE mg/dL
Specific Gravity, Urine: 1.021 (ref 1.005–1.030)
Urobilinogen, UA: 0.2 mg/dL (ref 0.0–1.0)
pH: 6 (ref 5.0–8.0)

## 2014-04-06 LAB — MAGNESIUM: MAGNESIUM: 1.8 mg/dL (ref 1.5–2.5)

## 2014-04-06 LAB — URINALYSIS, MICROSCOPIC ONLY
CASTS: NONE SEEN
Crystals: NONE SEEN
SQUAMOUS EPITHELIAL / LPF: NONE SEEN

## 2014-04-06 LAB — TESTOSTERONE: TESTOSTERONE: 243 ng/dL — AB (ref 300–890)

## 2014-04-06 LAB — VITAMIN D 25 HYDROXY (VIT D DEFICIENCY, FRACTURES): VIT D 25 HYDROXY: 54 ng/mL (ref 30–89)

## 2014-04-06 LAB — INSULIN, FASTING: Insulin fasting, serum: 5.1 u[IU]/mL (ref 2.0–19.6)

## 2014-04-06 LAB — PSA: PSA: 1.87 ng/mL (ref <=4.00)

## 2014-04-06 LAB — HEMOGLOBIN A1C
Hgb A1c MFr Bld: 5.1 % (ref <5.7)
Mean Plasma Glucose: 100 mg/dL (ref <117)

## 2014-04-08 ENCOUNTER — Other Ambulatory Visit: Payer: Self-pay | Admitting: Emergency Medicine

## 2014-04-08 LAB — URINE CULTURE: Colony Count: >=100000

## 2014-04-08 MED ORDER — CIPROFLOXACIN HCL 500 MG PO TABS
500.0000 mg | ORAL_TABLET | Freq: Two times a day (BID) | ORAL | Status: AC
Start: 2014-04-08 — End: 2014-04-11

## 2014-04-09 LAB — TB SKIN TEST
INDURATION: 0 mm
TB SKIN TEST: NEGATIVE

## 2014-05-15 ENCOUNTER — Other Ambulatory Visit: Payer: BLUE CROSS/BLUE SHIELD

## 2014-05-15 DIAGNOSIS — N3000 Acute cystitis without hematuria: Secondary | ICD-10-CM

## 2014-05-16 LAB — URINALYSIS, ROUTINE W REFLEX MICROSCOPIC
BILIRUBIN URINE: NEGATIVE
Glucose, UA: NEGATIVE mg/dL
Ketones, ur: NEGATIVE mg/dL
Leukocytes, UA: NEGATIVE
Nitrite: NEGATIVE
PH: 5.5 (ref 5.0–8.0)
PROTEIN: NEGATIVE mg/dL
Specific Gravity, Urine: 1.02 (ref 1.005–1.030)
Urobilinogen, UA: 0.2 mg/dL (ref 0.0–1.0)

## 2014-05-16 LAB — URINALYSIS, MICROSCOPIC ONLY
Bacteria, UA: NONE SEEN
CRYSTALS: NONE SEEN
Casts: NONE SEEN
Squamous Epithelial / LPF: NONE SEEN

## 2014-05-16 LAB — URINE CULTURE
Colony Count: NO GROWTH
Organism ID, Bacteria: NO GROWTH

## 2014-05-21 ENCOUNTER — Other Ambulatory Visit: Payer: Self-pay

## 2014-05-21 DIAGNOSIS — R319 Hematuria, unspecified: Secondary | ICD-10-CM

## 2015-04-11 ENCOUNTER — Encounter: Payer: Self-pay | Admitting: Emergency Medicine

## 2015-04-22 ENCOUNTER — Encounter: Payer: Self-pay | Admitting: Internal Medicine

## 2015-04-24 ENCOUNTER — Encounter: Payer: Self-pay | Admitting: Internal Medicine

## 2015-04-25 ENCOUNTER — Ambulatory Visit (INDEPENDENT_AMBULATORY_CARE_PROVIDER_SITE_OTHER): Payer: BLUE CROSS/BLUE SHIELD | Admitting: Internal Medicine

## 2015-04-25 ENCOUNTER — Encounter: Payer: Self-pay | Admitting: Internal Medicine

## 2015-04-25 VITALS — BP 124/88 | HR 90 | Temp 98.2°F | Resp 18 | Ht 65.5 in | Wt 194.0 lb

## 2015-04-25 DIAGNOSIS — I10 Essential (primary) hypertension: Secondary | ICD-10-CM | POA: Diagnosis not present

## 2015-04-25 DIAGNOSIS — Z79899 Other long term (current) drug therapy: Secondary | ICD-10-CM | POA: Diagnosis not present

## 2015-04-25 DIAGNOSIS — Z0000 Encounter for general adult medical examination without abnormal findings: Secondary | ICD-10-CM | POA: Diagnosis not present

## 2015-04-25 DIAGNOSIS — Z130 Encounter for screening for diseases of the blood and blood-forming organs and certain disorders involving the immune mechanism: Secondary | ICD-10-CM

## 2015-04-25 DIAGNOSIS — Z136 Encounter for screening for cardiovascular disorders: Secondary | ICD-10-CM

## 2015-04-25 DIAGNOSIS — Z0001 Encounter for general adult medical examination with abnormal findings: Secondary | ICD-10-CM

## 2015-04-25 DIAGNOSIS — E559 Vitamin D deficiency, unspecified: Secondary | ICD-10-CM | POA: Diagnosis not present

## 2015-04-25 DIAGNOSIS — Z1212 Encounter for screening for malignant neoplasm of rectum: Secondary | ICD-10-CM

## 2015-04-25 DIAGNOSIS — E782 Mixed hyperlipidemia: Secondary | ICD-10-CM

## 2015-04-25 DIAGNOSIS — Z131 Encounter for screening for diabetes mellitus: Secondary | ICD-10-CM

## 2015-04-25 MED ORDER — ATORVASTATIN CALCIUM 80 MG PO TABS: 80.0000 mg | ORAL_TABLET | ORAL | Status: DC

## 2015-04-25 NOTE — Patient Instructions (Signed)
Preventive Care for Adults  A healthy lifestyle and preventive care can promote health and wellness. Preventive health guidelines for men include the following key practices:  A routine yearly physical is a good way to check with your health care provider about your health and preventative screening. It is a chance to share any concerns and updates on your health and to receive a thorough exam.  Visit your dentist for a routine exam and preventative care every 6 months. Brush your teeth twice a day and floss once a day. Good oral hygiene prevents tooth decay and gum disease.  The frequency of eye exams is based on your age, health, family medical history, use of contact lenses, and other factors. Follow your health care provider's recommendations for frequency of eye exams.  Eat a healthy diet. Foods such as vegetables, fruits, whole grains, low-fat dairy products, and lean protein foods contain the nutrients you need without too many calories. Decrease your intake of foods high in solid fats, added sugars, and salt. Eat the right amount of calories for you.Get information about a proper diet from your health care provider, if necessary.  Regular physical exercise is one of the most important things you can do for your health. Most adults should get at least 150 minutes of moderate-intensity exercise (any activity that increases your heart rate and causes you to sweat) each week. In addition, most adults need muscle-strengthening exercises on 2 or more days a week.  Maintain a healthy weight. The body mass index (BMI) is a screening tool to identify possible weight problems. It provides an estimate of body fat based on height and weight. Your health care provider can find your BMI and can help you achieve or maintain a healthy weight.For adults 20 years and older:  A BMI below 18.5 is considered underweight.  A BMI of 18.5 to 24.9 is normal.  A BMI of 25 to 29.9 is considered overweight.  A  BMI of 30 and above is considered obese.  Maintain normal blood lipids and cholesterol levels by exercising and minimizing your intake of saturated fat. Eat a balanced diet with plenty of fruit and vegetables. Blood tests for lipids and cholesterol should begin at age 20 and be repeated every 5 years. If your lipid or cholesterol levels are high, you are over 50, or you are at high risk for heart disease, you may need your cholesterol levels checked more frequently.Ongoing high lipid and cholesterol levels should be treated with medicines if diet and exercise are not working.  If you smoke, find out from your health care provider how to quit. If you do not use tobacco, do not start.  Lung cancer screening is recommended for adults aged 55-80 years who are at high risk for developing lung cancer because of a history of smoking. A yearly low-dose CT scan of the lungs is recommended for people who have at least a 30-pack-year history of smoking and are a current smoker or have quit within the past 15 years. A pack year of smoking is smoking an average of 1 pack of cigarettes a day for 1 year (for example: 1 pack a day for 30 years or 2 packs a day for 15 years). Yearly screening should continue until the smoker has stopped smoking for at least 15 years. Yearly screening should be stopped for people who develop a health problem that would prevent them from having lung cancer treatment.  If you choose to drink alcohol, do not have more   than 2 drinks per day. One drink is considered to be 12 ounces (355 mL) of beer, 5 ounces (148 mL) of wine, or 1.5 ounces (44 mL) of liquor.  Avoid use of street drugs. Do not share needles with anyone. Ask for help if you need support or instructions about stopping the use of drugs.  High blood pressure causes heart disease and increases the risk of stroke. Your blood pressure should be checked at least every 1-2 years. Ongoing high blood pressure should be treated with  medicines, if weight loss and exercise are not effective.  If you are 45-79 years old, ask your health care provider if you should take aspirin to prevent heart disease.  Diabetes screening involves taking a blood sample to check your fasting blood sugar level. This should be done once every 3 years, after age 45, if you are within normal weight and without risk factors for diabetes. Testing should be considered at a younger age or be carried out more frequently if you are overweight and have at least 1 risk factor for diabetes.  Colorectal cancer can be detected and often prevented. Most routine colorectal cancer screening begins at the age of 50 and continues through age 75. However, your health care provider may recommend screening at an earlier age if you have risk factors for colon cancer. On a yearly basis, your health care provider may provide home test kits to check for hidden blood in the stool. Use of a small camera at the end of a tube to directly examine the colon (sigmoidoscopy or colonoscopy) can detect the earliest forms of colorectal cancer. Talk to your health care provider about this at age 50, when routine screening begins. Direct exam of the colon should be repeated every 5-10 years through age 75, unless early forms of precancerous polyps or small growths are found.   Talk with your health care provider about prostate cancer screening.  Testicular cancer screening isrecommended for adult males. Screening includes self-exam, a health care provider exam, and other screening tests. Consult with your health care provider about any symptoms you have or any concerns you have about testicular cancer.  Use sunscreen. Apply sunscreen liberally and repeatedly throughout the day. You should seek shade when your shadow is shorter than you. Protect yourself by wearing long sleeves, pants, a wide-brimmed hat, and sunglasses year round, whenever you are outdoors.  Once a month, do a whole-body  skin exam, using a mirror to look at the skin on your back. Tell your health care provider about new moles, moles that have irregular borders, moles that are larger than a pencil eraser, or moles that have changed in shape or color.  Stay current with required vaccines (immunizations).  Influenza vaccine. All adults should be immunized every year.  Tetanus, diphtheria, and acellular pertussis (Td, Tdap) vaccine. An adult who has not previously received Tdap or who does not know his vaccine status should receive 1 dose of Tdap. This initial dose should be followed by tetanus and diphtheria toxoids (Td) booster doses every 10 years. Adults with an unknown or incomplete history of completing a 3-dose immunization series with Td-containing vaccines should begin or complete a primary immunization series including a Tdap dose. Adults should receive a Td booster every 10 years.  Varicella vaccine. An adult without evidence of immunity to varicella should receive 2 doses or a second dose if he has previously received 1 dose.  Human papillomavirus (HPV) vaccine. Males aged 13-21 years who have not   received the vaccine previously should receive the 3-dose series. Males aged 22-26 years may be immunized. Immunization is recommended through the age of 26 years for any male who has sex with males and did not get any or all doses earlier. Immunization is recommended for any person with an immunocompromised condition through the age of 26 years if he did not get any or all doses earlier. During the 3-dose series, the second dose should be obtained 4-8 weeks after the first dose. The third dose should be obtained 24 weeks after the first dose and 16 weeks after the second dose.  Zoster vaccine. One dose is recommended for adults aged 60 years or older unless certain conditions are present.    PREVNAR  - Pneumococcal 13-valent conjugate (PCV13) vaccine. When indicated, a person who is uncertain of his immunization  history and has no record of immunization should receive the PCV13 vaccine. An adult aged 19 years or older who has certain medical conditions and has not been previously immunized should receive 1 dose of PCV13 vaccine. This PCV13 should be followed with a dose of pneumococcal polysaccharide (PPSV23) vaccine. The PPSV23 vaccine dose should be obtained at least 8 weeks after the dose of PCV13 vaccine. An adult aged 19 years or older who has certain medical conditions and previously received 1 or more doses of PPSV23 vaccine should receive 1 dose of PCV13. The PCV13 vaccine dose should be obtained 1 or more years after the last PPSV23 vaccine dose.    PNEUMOVAX - Pneumococcal polysaccharide (PPSV23) vaccine. When PCV13 is also indicated, PCV13 should be obtained first. All adults aged 65 years and older should be immunized. An adult younger than age 65 years who has certain medical conditions should be immunized. Any person who resides in a nursing home or long-term care facility should be immunized. An adult smoker should be immunized. People with an immunocompromised condition and certain other conditions should receive both PCV13 and PPSV23 vaccines. People with human immunodeficiency virus (HIV) infection should be immunized as soon as possible after diagnosis. Immunization during chemotherapy or radiation therapy should be avoided. Routine use of PPSV23 vaccine is not recommended for American Indians, [place] Natives, or people younger than 65 years unless there are medical conditions that require PPSV23 vaccine. When indicated, people who have unknown immunization and have no record of immunization should receive PPSV23 vaccine. One-time revaccination 5 years after the first dose of PPSV23 is recommended for people aged 19-64 years who have chronic kidney failure, nephrotic syndrome, asplenia, or immunocompromised conditions. People who received 1-2 doses of PPSV23 before age 65 years should receive another  dose of PPSV23 vaccine at age 65 years or later if at least 5 years have passed since the previous dose. Doses of PPSV23 are not needed for people immunized with PPSV23 at or after age 65 years.    Hepatitis A vaccine. Adults who wish to be protected from this disease, have certain high-risk conditions, work with hepatitis A-infected animals, work in hepatitis A research labs, or travel to or work in countries with a high rate of hepatitis A should be immunized. Adults who were previously unvaccinated and who anticipate close contact with an international adoptee during the first 60 days after arrival in the [place] from a country with a high rate of hepatitis A should be immunized.    Hepatitis B vaccine. Adults should be immunized if they wish to be protected from this disease, have certain high-risk conditions, may be exposed to   blood or other infectious body fluids, are household contacts or sex partners of hepatitis B positive people, are clients or workers in certain care facilities, or travel to or work in countries with a high rate of hepatitis B.   Preventive Service / Frequency   Ages 40 to 64  Blood pressure check.  Lipid and cholesterol check  Lung cancer screening. / Every year if you are aged 55-80 years and have a 30-pack-year history of smoking and currently smoke or have quit within the past 15 years. Yearly screening is stopped once you have quit smoking for at least 15 years or develop a health problem that would prevent you from having lung cancer treatment.  Fecal occult blood test (FOBT) of stool. / Every year beginning at age 50 and continuing until age 75. You may not have to do this test if you get a colonoscopy every 10 years.  Flexible sigmoidoscopy** or colonoscopy.** / Every 5 years for a flexible sigmoidoscopy or every 10 years for a colonoscopy beginning at age 50 and continuing until age 75. Screening for abdominal aortic aneurysm (AAA)  by ultrasound is  recommended for people who have history of high blood pressure or who are current or former smokers.   

## 2015-04-25 NOTE — Progress Notes (Signed)
Patient ID: Jason Curry, male   DOB: January 18, 1971, 44 y.o.   MRN: [id number]  Complete Physical  Assessment and Plan:   1. Essential hypertension  - Urinalysis, Routine w reflex microscopic ([facility]) - Microalbumin / creatinine urine ratio - EKG 12-Lead - TSH  2. Mixed hyperlipidemia  - Lipid panel  3. Vitamin D deficiency  - Vit D  25 hydroxy (rtn osteoporosis monitoring)  4. Medication management  - CBC with Differential/Platelet - BASIC METABOLIC PANEL WITH GFR - Hepatic function panel - Magnesium  5. Encounter for general adult medical examination with abnormal findings  6. Screening for diabetes mellitus  - Hemoglobin A1c - Insulin, random  7. Screening for deficiency anemia  - Iron and TIBC - Vitamin B12  8. Screening for cardiovascular condition  - EKG 12-Lead      Discussed med's effects and SE's. Screening labs and tests as requested with regular follow-up as recommended.  HPI Patient presents for a complete physical.   His blood pressure has been controlled at home, today their BP is BP: 124/88 mmHg He does not workout. He denies chest pain, shortness of breath, dizziness.  He does have a very physical job though and works 2 am to 4 pm.  He walks at least 10,000 steps daily.   He is on cholesterol medication and denies myalgias. His cholesterol is not at goal. The cholesterol last visit was:   Lab Results  Component Value Date   CHOL 203* 04/05/2014   HDL 37* 04/05/2014   LDLCALC 139* 04/05/2014   TRIG 136 04/05/2014   CHOLHDL 5.5 04/05/2014  He is taking 1/2  Tablet qOD.  He reports that he is not always good about taking his medicaiton.    Patient is on Vitamin D supplement.  He reports that he has been out of it for the past month.   Lab Results  Component Value Date   VD25OH 54 04/05/2014      Current Medications:  Current Outpatient Prescriptions on File Prior to Visit  Medication Sig Dispense Refill  . Ascorbic Acid  (VITAMIN C) 1000 MG tablet Take 1,000 mg by mouth daily.    Marland Kitchen. atorvastatin (LIPITOR) 80 MG tablet Take 80 mg by mouth every other day.    . cholecalciferol (VITAMIN D) 1000 UNITS tablet Take 8,000 Units by mouth daily.    . Magnesium 250 MG TABS Take by mouth.     No current facility-administered medications on file prior to visit.    Health Maintenance:  Immunization History  Administered Date(s) Administered  . DTaP 09/16/2006  . PPD Test 04/05/2014  . Pneumococcal Polysaccharide-23 11/29/2008    Tetanus: 2008 Flu vaccine:Due 2 weeks Eye Exam: Due to see next month Dentist: Doesn't see one  Patient Care Team: Lucky CowboyWilliam McKeown, MD as PCP - General (Internal Medicine)  Allergies: No Known Allergies  Medical History:  Past Medical History  Diagnosis Date  . Hypertension   . Hyperlipidemia   . Hypogonadism male     Surgical History: No past surgical history on file.  Family History:  Family History  Problem Relation Age of Onset  . Cancer Mother     thyroid  . Heart disease Father   . Hypertension Father   . Hyperlipidemia Father     Social History:   Social History  Substance Use Topics  . Smoking status: Former Smoker    Quit date: 06/13/1993  . Smokeless tobacco: Current User    Types: Chew  .  Alcohol Use: 0.5 oz/week    1 drink(s) per week    Review of Systems:  Review of Systems  Constitutional: Negative for fever, chills and malaise/fatigue.  HENT: Negative for congestion, ear pain and sore throat.   Eyes: Negative.   Respiratory: Negative for cough, shortness of breath and wheezing.   Cardiovascular: Negative for chest pain, palpitations and leg swelling.  Gastrointestinal: Positive for heartburn. Negative for abdominal pain, diarrhea, constipation, blood in stool and melena.  Genitourinary: Negative.   Skin: Negative.   Neurological: Negative for dizziness, sensory change, loss of consciousness and headaches.  Psychiatric/Behavioral: Negative  for depression. The patient is not nervous/anxious and does not have insomnia.     Physical Exam: Estimated body mass index is 31.78 kg/(m^2) as calculated from the following:   Height as of this encounter: 5' 5.5" (1.664 m).   Weight as of this encounter: 194 lb (87.998 kg). BP 124/88 mmHg  Pulse 90  Temp(Src) 98.2 F (36.8 C) (Temporal)  Resp 18  Ht 5' 5.5" (1.664 m)  Wt 194 lb (87.998 kg)  BMI 31.78 kg/m2  General Appearance: Well nourished, in no apparent distress.  Eyes: PERRLA, EOMs, conjunctiva no swelling or erythema ENT/Mouth: Ear canals clear bilaterally with no erythema, swelling, discharge.  TMs normal bilaterally with no erythema, bulging, or retractions.  Oropharynx clear and moist with no exudate, swelling, or erythema.  Dentition normal.   Neck: Supple, thyroid normal. No bruits, JVD, cervical adenopathy Respiratory: Respiratory effort normal, BS equal bilaterally without rales, rhonchi, wheezing or stridor.  Cardio: RRR without murmurs, rubs or gallops. Brisk peripheral pulses without edema.  Chest: symmetric, with normal excursions Abdomen: Soft, nontender, no guarding, rebound, hernias, masses, or organomegaly. Genitourinary:  Musculoskeletal: Full ROM all peripheral extremities,5/5 strength, and normal gait.  Skin: Warm, dry without rashes, lesions, ecchymosis. Neuro: A&Ox3, Cranial nerves intact, reflexes equal bilaterally. Normal muscle tone, no cerebellar symptoms. Sensation intact.  Psych: Normal affect, Insight and Judgment appropriate.   EKG: WNL no changes.  Over 40 minutes of exam, counseling, chart review and critical decision making was performed  Jason Curry 3:26 PM [facility]

## 2015-04-26 LAB — BASIC METABOLIC PANEL WITH GFR
BUN: 17 mg/dL (ref 7–25)
CALCIUM: 9.3 mg/dL (ref 8.6–10.3)
CO2: 24 mmol/L (ref 20–31)
CREATININE: 0.93 mg/dL (ref 0.60–1.35)
Chloride: 101 mmol/L (ref 98–110)
GFR, Est African American: >89 mL/min (ref >=60)
Glucose, Bld: 71 mg/dL (ref 65–99)
Potassium: 4.1 mmol/L (ref 3.5–5.3)
SODIUM: 139 mmol/L (ref 135–146)

## 2015-04-26 LAB — URINALYSIS, MICROSCOPIC ONLY
BACTERIA UA: NONE SEEN [HPF]
CASTS: NONE SEEN [LPF]
Crystals: NONE SEEN [HPF]
RBC / HPF: NONE SEEN RBC/HPF (ref <=2)
SQUAMOUS EPITHELIAL / LPF: NONE SEEN [HPF] (ref <=5)
WBC, UA: NONE SEEN WBC/HPF (ref <=5)
YEAST: NONE SEEN [HPF]

## 2015-04-26 LAB — LIPID PANEL
CHOL/HDL RATIO: 6.9 ratio — AB (ref <=5.0)
CHOLESTEROL: 255 mg/dL — AB (ref 125–200)
HDL: 37 mg/dL — AB (ref >=40)
LDL Cholesterol: 185 mg/dL — ABNORMAL HIGH (ref <130)
TRIGLYCERIDES: 163 mg/dL — AB (ref <150)
VLDL: 33 mg/dL — AB (ref <30)

## 2015-04-26 LAB — CBC WITH DIFFERENTIAL/PLATELET
BASOS PCT: 0 % (ref 0–1)
Basophils Absolute: 0 10*3/uL (ref 0.0–0.1)
EOS ABS: 0.1 10*3/uL (ref 0.0–0.7)
Eosinophils Relative: 1 % (ref 0–5)
HCT: 44.1 % (ref 39.0–52.0)
HEMOGLOBIN: 15.9 g/dL (ref 13.0–17.0)
Lymphocytes Relative: 19 % (ref 12–46)
Lymphs Abs: 1.6 10*3/uL (ref 0.7–4.0)
MCH: 32.8 pg (ref 26.0–34.0)
MCHC: 36.1 g/dL — AB (ref 30.0–36.0)
MCV: 90.9 fL (ref 78.0–100.0)
MONOS PCT: 8 % (ref 3–12)
MPV: 9.6 fL (ref 8.6–12.4)
Monocytes Absolute: 0.7 10*3/uL (ref 0.1–1.0)
NEUTROS ABS: 5.9 10*3/uL (ref 1.7–7.7)
NEUTROS PCT: 72 % (ref 43–77)
PLATELETS: 251 10*3/uL (ref 150–400)
RBC: 4.85 MIL/uL (ref 4.22–5.81)
RDW: 14.5 % (ref 11.5–15.5)
WBC: 8.2 10*3/uL (ref 4.0–10.5)

## 2015-04-26 LAB — HEMOGLOBIN A1C
HEMOGLOBIN A1C: 5.1 % (ref <5.7)
Mean Plasma Glucose: 100 mg/dL (ref <117)

## 2015-04-26 LAB — URINALYSIS, ROUTINE W REFLEX MICROSCOPIC
BILIRUBIN URINE: NEGATIVE
Glucose, UA: NEGATIVE
KETONES UR: NEGATIVE
Leukocytes, UA: NEGATIVE
NITRITE: NEGATIVE
PROTEIN: NEGATIVE
Specific Gravity, Urine: 1.02 (ref 1.001–1.035)
pH: 5 (ref 5.0–8.0)

## 2015-04-26 LAB — MICROALBUMIN / CREATININE URINE RATIO
Creatinine, Urine: 119.5 mg/dL
MICROALB UR: 2.9 mg/dL — AB (ref <2.0)
Microalb Creat Ratio: 24.3 mg/g (ref 0.0–30.0)

## 2015-04-26 LAB — HEPATIC FUNCTION PANEL
ALT: 31 U/L (ref 9–46)
AST: 22 U/L (ref 10–40)
Albumin: 4.9 g/dL (ref 3.6–5.1)
Alkaline Phosphatase: 92 U/L (ref 40–115)
BILIRUBIN DIRECT: 0.1 mg/dL (ref <=0.2)
BILIRUBIN INDIRECT: 0.6 mg/dL (ref 0.2–1.2)
BILIRUBIN TOTAL: 0.7 mg/dL (ref 0.2–1.2)
Total Protein: 7.4 g/dL (ref 6.1–8.1)

## 2015-04-26 LAB — MAGNESIUM: MAGNESIUM: 2.2 mg/dL (ref 1.5–2.5)

## 2015-04-26 LAB — VITAMIN B12: VITAMIN B 12: 363 pg/mL (ref 211–911)

## 2015-04-26 LAB — IRON AND TIBC
%SAT: 33 % (ref 15–60)
Iron: 141 ug/dL (ref 50–180)
TIBC: 421 ug/dL (ref 250–425)
UIBC: 280 ug/dL (ref 125–400)

## 2015-04-26 LAB — TSH: TSH: 2.03 u[IU]/mL (ref 0.350–4.500)

## 2015-04-26 LAB — VITAMIN D 25 HYDROXY (VIT D DEFICIENCY, FRACTURES): Vit D, 25-Hydroxy: 27 ng/mL — ABNORMAL LOW (ref 30–100)

## 2015-04-26 LAB — INSULIN, RANDOM: Insulin: 8.7 u[IU]/mL (ref 2.0–19.6)

## 2015-10-28 ENCOUNTER — Ambulatory Visit: Payer: Self-pay | Admitting: Internal Medicine

## 2016-04-21 ENCOUNTER — Encounter: Payer: Self-pay | Admitting: Internal Medicine

## 2016-04-27 ENCOUNTER — Encounter: Payer: Self-pay | Admitting: Internal Medicine

## 2016-04-27 ENCOUNTER — Ambulatory Visit (INDEPENDENT_AMBULATORY_CARE_PROVIDER_SITE_OTHER): Payer: BLUE CROSS/BLUE SHIELD | Admitting: Internal Medicine

## 2016-04-27 VITALS — BP 122/80 | HR 80 | Temp 98.2°F | Resp 16 | Ht 66.0 in | Wt 206.0 lb

## 2016-04-27 DIAGNOSIS — Z0000 Encounter for general adult medical examination without abnormal findings: Secondary | ICD-10-CM

## 2016-04-27 DIAGNOSIS — Z131 Encounter for screening for diabetes mellitus: Secondary | ICD-10-CM

## 2016-04-27 DIAGNOSIS — Z1389 Encounter for screening for other disorder: Secondary | ICD-10-CM

## 2016-04-27 DIAGNOSIS — E782 Mixed hyperlipidemia: Secondary | ICD-10-CM

## 2016-04-27 DIAGNOSIS — Z1329 Encounter for screening for other suspected endocrine disorder: Secondary | ICD-10-CM

## 2016-04-27 DIAGNOSIS — E559 Vitamin D deficiency, unspecified: Secondary | ICD-10-CM

## 2016-04-27 DIAGNOSIS — Z79899 Other long term (current) drug therapy: Secondary | ICD-10-CM

## 2016-04-27 DIAGNOSIS — Z0001 Encounter for general adult medical examination with abnormal findings: Secondary | ICD-10-CM

## 2016-04-27 DIAGNOSIS — Z130 Encounter for screening for diseases of the blood and blood-forming organs and certain disorders involving the immune mechanism: Secondary | ICD-10-CM

## 2016-04-27 DIAGNOSIS — Z136 Encounter for screening for cardiovascular disorders: Secondary | ICD-10-CM

## 2016-04-27 LAB — CBC WITH DIFFERENTIAL/PLATELET
BASOS PCT: 0 %
Basophils Absolute: 0 cells/uL (ref 0–200)
EOS PCT: 1 %
Eosinophils Absolute: 72 cells/uL (ref 15–500)
HCT: 46.9 % (ref 38.5–50.0)
HEMOGLOBIN: 15.9 g/dL (ref 13.2–17.1)
LYMPHS ABS: 1440 {cells}/uL (ref 850–3900)
Lymphocytes Relative: 20 %
MCH: 32.3 pg (ref 27.0–33.0)
MCHC: 33.9 g/dL (ref 32.0–36.0)
MCV: 95.3 fL (ref 80.0–100.0)
MONO ABS: 504 {cells}/uL (ref 200–950)
MPV: 8.9 fL (ref 7.5–12.5)
Monocytes Relative: 7 %
NEUTROS ABS: 5184 {cells}/uL (ref 1500–7800)
Neutrophils Relative %: 72 %
Platelets: 239 10*3/uL (ref 140–400)
RBC: 4.92 MIL/uL (ref 4.20–5.80)
RDW: 13.9 % (ref 11.0–15.0)
WBC: 7.2 10*3/uL (ref 3.8–10.8)

## 2016-04-27 LAB — TSH: TSH: 2.3 m[IU]/L (ref 0.40–4.50)

## 2016-04-27 MED ORDER — ATORVASTATIN CALCIUM 80 MG PO TABS: ORAL_TABLET | ORAL | 3 refills | Status: DC

## 2016-04-27 NOTE — Progress Notes (Signed)
Complete Physical  Assessment and Plan:   1. Encounter for general adult medical examination with abnormal findings  - CBC with Differential/Platelet - BASIC METABOLIC PANEL WITH GFR - Hepatic function panel - Magnesium  2. Mixed hyperlipidemia  - Lipid panel  3. Screening for diabetes mellitus  - Hemoglobin A1c - Insulin, random  4. Screening for deficiency anemia  - Iron and TIBC - Vitamin B12  5. Screening for hematuria or proteinuria  - Urinalysis, Routine w reflex microscopic (not at [facility]) - Microalbumin / creatinine urine ratio  6. Screening for cardiovascular condition  - EKG 12-Lead  7. Vitamin D deficiency  - VITAMIN D 25 Hydroxy (Vit-D Deficiency, Fractures)  8. Screening for thyroid disorder - TSH   Discussed med's effects and SE's. Screening labs and tests as requested with regular follow-up as recommended.  HPI Patient presents for a complete physical.   His blood pressure has been controlled at home, today their BP is BP: 122/80 He does not workout. He denies chest pain, shortness of breath, dizziness.  He reports that he likes to do yardwork as exercise.  He does tend to do a lot of walking for work.  He is on his feet all day.     He is on cholesterol medication and denies myalgias. His cholesterol is at goal. The cholesterol last visit was:   Lab Results  Component Value Date   CHOL 255 (H) 04/25/2015   HDL 37 (L) 04/25/2015   LDLCALC 185 (H) 04/25/2015   TRIG 163 (H) 04/25/2015   CHOLHDL 6.9 (H) 04/25/2015  He only takes his cholesterol medication is he remembers it.  He forgets it most of the time and he recently ran out.    He has been working on diet and exercise for prediabetes, he is on bASA, he is on ACE/ARB and denies foot ulcerations, hyperglycemia, hypoglycemia , increased appetite, nausea, paresthesia of the feet, polydipsia, polyuria, visual disturbances, vomiting and weight loss. Last A1C in the office was:  Lab Results   Component Value Date   HGBA1C 5.1 04/25/2015    Patient is on Vitamin D supplement.   Lab Results  Component Value Date   VD25OH 27 (L) 04/25/2015      Last PSA was: Lab Results  Component Value Date   PSA 1.87 04/05/2014  .  Denies BPH symptoms daytime frequency, double voiding, dysuria, hematuria, hesitancy, incontinence, intermittency, nocturia, sensation of incomplete bladder emptying, suprapubic pain, urgency or weak urinary stream.  No family history of colon cancer.  No changes in family history.    Current Medications:  Current Outpatient Prescriptions on File Prior to Visit  Medication Sig Dispense Refill  . atorvastatin (LIPITOR) 80 MG tablet Take 1 tablet (80 mg total) by mouth every other day. 90 tablet 0   No current facility-administered medications on file prior to visit.     Health Maintenance:  Immunization History  Administered Date(s) Administered  . DTaP 09/16/2006  . PPD Test 04/05/2014  . Pneumococcal Polysaccharide-23 11/29/2008    Tetanus:2008 Pneumovax: 2010 Flu vaccine:  Getting it done at work next week Eye Exam:  3-4 months ago, Dr. Abel Curry Dentist: Goes 3 times per year  Patient Care Team: Jason Cowboy, MD as PCP - General (Internal Medicine)  Allergies: No Known Allergies  Medical History:  Past Medical History:  Diagnosis Date  . Hyperlipidemia   . Hypertension   . Hypogonadism male     Surgical History: No past surgical history on file.  Family History:  Family History  Problem Relation Age of Onset  . Cancer Mother     thyroid  . Heart disease Father   . Hypertension Father   . Hyperlipidemia Father     Social History:   Social History  Substance Use Topics  . Smoking status: Former Smoker    Quit date: 06/13/1993  . Smokeless tobacco: Current User    Types: Chew  . Alcohol use 0.5 oz/week    1 drink(s) per week    Review of Systems:  Review of Systems  Constitutional: Negative for chills, fever and  malaise/fatigue.  HENT: Negative for congestion, ear pain and sore throat.   Eyes: Negative.   Respiratory: Negative for cough, shortness of breath and wheezing.   Cardiovascular: Negative for chest pain, palpitations and leg swelling.  Gastrointestinal: Negative for abdominal pain, blood in stool, constipation, diarrhea, heartburn and melena.  Genitourinary: Negative.   Skin: Negative.   Neurological: Negative for dizziness, sensory change, loss of consciousness and headaches.  Psychiatric/Behavioral: Negative for depression. The patient is not nervous/anxious and does not have insomnia.     Physical Exam: Estimated body mass index is 33.25 kg/m as calculated from the following:   Height as of this encounter: 5\' 6"  (1.676 m).   Weight as of this encounter: 206 lb (93.4 kg). BP 122/80   Pulse 80   Temp 98.2 F (36.8 C) (Temporal)   Resp 16   Ht 5\' 6"  (1.676 m)   Wt 206 lb (93.4 kg)   BMI 33.25 kg/m   General Appearance: Well nourished, in no apparent distress.  Eyes: PERRLA, EOMs, conjunctiva no swelling or erythema ENT/Mouth: Ear canals clear bilaterally with no erythema, swelling, discharge.  TMs normal bilaterally with no erythema, bulging, or retractions.  Oropharynx clear and moist with no exudate, swelling, or erythema.  Dentition normal.   Neck: Supple, thyroid normal. No bruits, JVD, cervical adenopathy Respiratory: Respiratory effort normal, BS equal bilaterally without rales, rhonchi, wheezing or stridor.  Cardio: RRR without murmurs, rubs or gallops. Brisk peripheral pulses without edema.  Chest: symmetric, with normal excursions Abdomen: Soft, nontender, no guarding, rebound, hernias, masses, or organomegaly. Musculoskeletal: Full ROM all peripheral extremities,5/5 strength, and normal gait.  Skin: Warm, dry without rashes, lesions, ecchymosis. Neuro: A&Ox3, Cranial nerves intact, reflexes equal bilaterally. Normal muscle tone, no cerebellar symptoms. Sensation  intact.  Psych: Normal affect, Insight and Judgment appropriate.   EKG: WNL no changes.  Over 40 minutes of exam, counseling, chart review and critical decision making was performed  [name] 3:30 PM [facility]

## 2016-04-28 LAB — HEPATIC FUNCTION PANEL
ALK PHOS: 92 U/L (ref 40–115)
ALT: 20 U/L (ref 9–46)
AST: 18 U/L (ref 10–40)
Albumin: 4.7 g/dL (ref 3.6–5.1)
BILIRUBIN DIRECT: 0.1 mg/dL (ref <=0.2)
BILIRUBIN INDIRECT: 0.5 mg/dL (ref 0.2–1.2)
Total Bilirubin: 0.6 mg/dL (ref 0.2–1.2)
Total Protein: 7.5 g/dL (ref 6.1–8.1)

## 2016-04-28 LAB — URINALYSIS, ROUTINE W REFLEX MICROSCOPIC
Bilirubin Urine: NEGATIVE
Glucose, UA: NEGATIVE
KETONES UR: NEGATIVE
Leukocytes, UA: NEGATIVE
NITRITE: NEGATIVE
Protein, ur: NEGATIVE
SPECIFIC GRAVITY, URINE: 1.016 (ref 1.001–1.035)
pH: 6.5 (ref 5.0–8.0)

## 2016-04-28 LAB — LIPID PANEL
CHOL/HDL RATIO: 6.1 ratio — AB (ref <=5.0)
CHOLESTEROL: 227 mg/dL — AB (ref 125–200)
HDL: 37 mg/dL — ABNORMAL LOW (ref >=40)
LDL Cholesterol: 159 mg/dL — ABNORMAL HIGH (ref <130)
Triglycerides: 155 mg/dL — ABNORMAL HIGH (ref <150)
VLDL: 31 mg/dL — AB (ref <30)

## 2016-04-28 LAB — URINALYSIS, MICROSCOPIC ONLY
BACTERIA UA: NONE SEEN [HPF]
CASTS: NONE SEEN [LPF]
CRYSTALS: NONE SEEN [HPF]
Squamous Epithelial / LPF: NONE SEEN [HPF] (ref <=5)
WBC, UA: NONE SEEN WBC/HPF (ref <=5)
Yeast: NONE SEEN [HPF]

## 2016-04-28 LAB — IRON AND TIBC
%SAT: 21 % (ref 15–60)
Iron: 86 ug/dL (ref 50–180)
TIBC: 417 ug/dL (ref 250–425)
UIBC: 331 ug/dL (ref 125–400)

## 2016-04-28 LAB — HEMOGLOBIN A1C
Hgb A1c MFr Bld: 4.8 % (ref <5.7)
MEAN PLASMA GLUCOSE: 91 mg/dL

## 2016-04-28 LAB — BASIC METABOLIC PANEL WITH GFR
BUN: 12 mg/dL (ref 7–25)
CALCIUM: 9.6 mg/dL (ref 8.6–10.3)
CHLORIDE: 101 mmol/L (ref 98–110)
CO2: 23 mmol/L (ref 20–31)
Creat: 1 mg/dL (ref 0.60–1.35)
GFR, Est African American: >89 mL/min (ref >=60)
Glucose, Bld: 85 mg/dL (ref 65–99)
POTASSIUM: 4.3 mmol/L (ref 3.5–5.3)
SODIUM: 139 mmol/L (ref 135–146)

## 2016-04-28 LAB — MAGNESIUM: Magnesium: 1.9 mg/dL (ref 1.5–2.5)

## 2016-04-28 LAB — MICROALBUMIN / CREATININE URINE RATIO
Creatinine, Urine: 111 mg/dL (ref 20–370)
MICROALB UR: 0.8 mg/dL
Microalb Creat Ratio: 7 mcg/mg creat (ref <30)

## 2016-04-28 LAB — VITAMIN B12: Vitamin B-12: 332 pg/mL (ref 200–1100)

## 2016-04-28 LAB — INSULIN, RANDOM: Insulin: 6.1 u[IU]/mL (ref 2.0–19.6)

## 2016-04-28 LAB — VITAMIN D 25 HYDROXY (VIT D DEFICIENCY, FRACTURES): Vit D, 25-Hydroxy: 32 ng/mL (ref 30–100)

## 2016-11-25 ENCOUNTER — Encounter: Payer: Self-pay | Admitting: *Deleted

## 2016-12-09 ENCOUNTER — Encounter: Payer: Self-pay | Admitting: *Deleted

## 2017-01-11 ENCOUNTER — Telehealth: Payer: Self-pay | Admitting: Physician Assistant

## 2017-01-11 MED ORDER — AZITHROMYCIN 250 MG PO TABS: ORAL_TABLET | ORAL | 1 refills | Status: AC

## 2017-01-11 MED ORDER — PROMETHAZINE-DM 6.25-15 MG/5ML PO SYRP: 5.0000 mL | ORAL_SOLUTION | Freq: Four times a day (QID) | ORAL | 1 refills | Status: DC | PRN

## 2017-01-11 MED ORDER — PREDNISONE 20 MG PO TABS: ORAL_TABLET | ORAL | 0 refills | Status: DC

## 2017-01-11 NOTE — Telephone Encounter (Signed)
VM box not set up Unable to LVM

## 2017-01-11 NOTE — Telephone Encounter (Signed)
Pt was made aware of meds that were sent to pharmacy & voiced understanding of instructions

## 2017-01-11 NOTE — Telephone Encounter (Signed)
Patient calling with sinus issues x 3 weeks, has been taking sinus meds, has fever, sore throat, sinus drainage, pressure, cough with minimal yellow mucus. Make sure he is on allergy pill, will send in cough syrup, ABX and prednisone. If he is not better in 7-10 days he needs OV, if worse needs to go to the ER  Sent stuff via mychart but patient states does not have so call too please

## 2017-03-11 ENCOUNTER — Telehealth: Payer: Self-pay | Admitting: *Deleted

## 2017-03-11 NOTE — Telephone Encounter (Signed)
Pt's wife called said patient isn't aware of her call but she's concerned about husband. Says she know his job is taking a toll on him. He's stressed & drinking every night in order to fall asleep. Has had low testosterone in the past has no sex drive. Says he is self medicating with alcohol.  Checked pt's last appointment he hasn't been in since last Oct told her he would need to be seen & we also need to reschedule his physical form [name] to another provider.  Pt's wife aware & is going to call [name] to try & get him to come in for a appointment.  =SB

## 2017-04-27 ENCOUNTER — Encounter: Payer: Self-pay | Admitting: Internal Medicine

## 2017-05-03 ENCOUNTER — Ambulatory Visit (INDEPENDENT_AMBULATORY_CARE_PROVIDER_SITE_OTHER): Payer: BLUE CROSS/BLUE SHIELD | Admitting: Internal Medicine

## 2017-05-03 ENCOUNTER — Encounter: Payer: Self-pay | Admitting: Internal Medicine

## 2017-05-03 VITALS — BP 108/78 | HR 80 | Temp 97.5°F | Resp 18 | Ht 66.5 in | Wt 197.4 lb

## 2017-05-03 DIAGNOSIS — Z111 Encounter for screening for respiratory tuberculosis: Secondary | ICD-10-CM | POA: Diagnosis not present

## 2017-05-03 DIAGNOSIS — Z1212 Encounter for screening for malignant neoplasm of rectum: Secondary | ICD-10-CM

## 2017-05-03 DIAGNOSIS — R0989 Other specified symptoms and signs involving the circulatory and respiratory systems: Secondary | ICD-10-CM

## 2017-05-03 DIAGNOSIS — I10 Essential (primary) hypertension: Secondary | ICD-10-CM | POA: Diagnosis not present

## 2017-05-03 DIAGNOSIS — Z136 Encounter for screening for cardiovascular disorders: Secondary | ICD-10-CM

## 2017-05-03 DIAGNOSIS — Z0000 Encounter for general adult medical examination without abnormal findings: Secondary | ICD-10-CM

## 2017-05-03 DIAGNOSIS — E559 Vitamin D deficiency, unspecified: Secondary | ICD-10-CM | POA: Diagnosis not present

## 2017-05-03 DIAGNOSIS — E291 Testicular hypofunction: Secondary | ICD-10-CM | POA: Diagnosis not present

## 2017-05-03 DIAGNOSIS — R5383 Other fatigue: Secondary | ICD-10-CM

## 2017-05-03 DIAGNOSIS — Z1211 Encounter for screening for malignant neoplasm of colon: Secondary | ICD-10-CM

## 2017-05-03 DIAGNOSIS — Z23 Encounter for immunization: Secondary | ICD-10-CM

## 2017-05-03 DIAGNOSIS — Z79899 Other long term (current) drug therapy: Secondary | ICD-10-CM | POA: Diagnosis not present

## 2017-05-03 DIAGNOSIS — Z125 Encounter for screening for malignant neoplasm of prostate: Secondary | ICD-10-CM

## 2017-05-03 DIAGNOSIS — Z131 Encounter for screening for diabetes mellitus: Secondary | ICD-10-CM

## 2017-05-03 DIAGNOSIS — E782 Mixed hyperlipidemia: Secondary | ICD-10-CM

## 2017-05-03 DIAGNOSIS — Z0001 Encounter for general adult medical examination with abnormal findings: Secondary | ICD-10-CM

## 2017-05-03 NOTE — Patient Instructions (Signed)

## 2017-05-03 NOTE — Progress Notes (Signed)
[facility]   Lucky CowboyWilliam Malia Corsi, M.D.     Dyanne CarrelAmanda R. Steffanie Dunnollier, P.A.-C Judd GaudierAshley Corbett, DNP [facility]                [address]                [place], [place] [id number] Telephone [phone] Telefax [phone] Annual  Screening/Preventative Visit  & Comprehensive Evaluation & Examination     This very nice 46 y.o. MWM presents for a Screening/Preventative Visit & comprehensive evaluation and management of multiple medical co-morbidities.  Patient has been followed for HTN, Prediabetes, Hyperlipidemia and Vitamin D Deficiency.   Also, the patient has history of testosterone deficiency with last testosterone level 315 - low normal in August. He denies any issues with undue fatigue, decreased libido or ED issues.     Patient has been followed expectant since 2010 with labileHTN. Patient's BP has been controlled at home.  Today's BP is at goal - 108/78. Patient denies any cardiac symptoms as chest pain, palpitations, shortness of breath, dizziness or ankle swelling.     Patient's hyperlipidemia is controlled with diet and medications. Patient denies myalgias or other medication SE's. Last lipids were not at goal: Lab Results  Component Value Date   CHOL 227 (H) 04/27/2016   HDL 37 (L) 04/27/2016   LDLCALC 159 (H) 04/27/2016   TRIG 155 (H) 04/27/2016   CHOLHDL 6.1 (H) 04/27/2016      Patient has prediabetes since    and patient denies reactive hypoglycemic symptoms, visual blurring, diabetic polys or paresthesias. Last A1c was at goal: Lab Results  Component Value Date   HGBA1C 4.8 04/27/2016         Also, the patient has history of testosterone deficiency with last testosterone level 315 - low normal in 2009. He denies any issues with undue fatigue, decreased libido or ED issues. Finally, patient has history of Vitamin D Deficiency of  "6527" in 2008 and last vitamin D was still low :  Lab Results  Component Value Date   VD25OH 32 04/27/2016   Current Outpatient Prescriptions on File Prior to Visit  Medication Sig  . atorvastatin (LIPITOR) 80 MG tablet Take 1/2 tablet to 1 tablet every day for cholesterol   No current facility-administered medications on file prior to visit.    No Known Allergies Past Medical History:  Diagnosis Date  . Hyperlipidemia   . Hypertension   . Hypogonadism male    Health Maintenance  Topic Date Due  . TETANUS/TDAP  09/15/2016  . INFLUENZA VACCINE  02/10/2017  . HIV Screening  05/13/2017 (Originally 12/10/1985)   Immunization History  Administered Date(s) Administered  . PPD Test 04/05/2014, 05/03/2017  . Pneumococcal Polysaccharide-23 11/29/2008  . Td 05/03/2017  . Tdap 09/16/2006   No past surgical history on file. Family History  Problem Relation Age of Onset  . Cancer Mother        thyroid  . Heart disease Father   . Hypertension Father   . Hyperlipidemia Father    Social History   Social History  . Marital status: Single    Spouse name: N/A  . Number of children: N/A  . Years of education: N/A   Occupational History  . Maintenance at [employer]   Social History Main Topics  . Smoking status: Former Smoker    Quit date: 06/13/1993  . Smokeless tobacco: Current User    Types: Chew  . Alcohol use  0.5 oz/week    1 drink(s) per week  . Drug use: No  . Sexual activity: Acyive    ROS Constitutional: Denies fever, chills, weight loss/gain, headaches, insomnia,  night sweats or change in appetite. Does c/o fatigue. Eyes: Denies redness, blurred vision, diplopia, discharge, itchy or watery eyes.  ENT: Denies discharge, congestion, post nasal drip, epistaxis, sore throat, earache, hearing loss, dental pain, Tinnitus, Vertigo, Sinus pain or snoring.  Cardio: Denies chest pain, palpitations, irregular heartbeat, syncope, dyspnea, diaphoresis, orthopnea, PND, claudication or edema Respiratory: denies cough, dyspnea, DOE, pleurisy, hoarseness, laryngitis or  wheezing.  Gastrointestinal: Denies dysphagia, heartburn, reflux, water brash, pain, cramps, nausea, vomiting, bloating, diarrhea, constipation, hematemesis, melena, hematochezia, jaundice or hemorrhoids Genitourinary: Denies dysuria, frequency, urgency, nocturia, hesitancy, discharge, hematuria or flank pain Musculoskeletal: Denies arthralgia, myalgia, stiffness, Jt. Swelling, pain, limp or strain/sprain. Denies Falls. Skin: Denies puritis, rash, hives, warts, acne, eczema or change in skin lesion Neuro: No weakness, tremor, incoordination, spasms, paresthesia or pain Psychiatric: Denies confusion, memory loss or sensory loss. Denies Depression. Endocrine: Denies change in weight, skin, hair change, nocturia, and paresthesia, diabetic polys, visual blurring or hyper / hypo glycemic episodes.  Heme/Lymph: No excessive bleeding, bruising or enlarged lymph nodes.  Physical Exam  BP 108/78   Pulse 80   Temp (!) 97.5 F (36.4 C)   Resp 18   Ht 5' 6.5" (1.689 m)   Wt 197 lb 6.4 oz (89.5 kg)   BMI 31.38 kg/m   General Appearance: Well nourished and well groomed and in no apparent distress.  Eyes: PERRLA, EOMs, conjunctiva no swelling or erythema, normal fundi and vessels. Sinuses: No frontal/maxillary tenderness ENT/Mouth: EACs patent / TMs  nl. Nares clear without erythema, swelling, mucoid exudates. Oral hygiene is good. No erythema, swelling, or exudate. Tongue normal, non-obstructing. Tonsils not swollen or erythematous. Hearing normal.  Neck: Supple, thyroid normal. No bruits, nodes or JVD. Respiratory: Respiratory effort normal.  BS equal and clear bilateral without rales, rhonci, wheezing or stridor. Cardio: Heart sounds are normal with regular rate and rhythm and no murmurs, rubs or gallops. Peripheral pulses are normal and equal bilaterally without edema. No aortic or femoral bruits. Chest: symmetric with normal excursions and percussion.  Abdomen: Soft, with Nl bowel sounds.  Nontender, no guarding, rebound, hernias, masses, or organomegaly.  Lymphatics: Non tender without lymphadenopathy.  Genitourinary: No hernias.Testes nl. DRE - prostate nl for age - smooth & firm w/o nodules. Musculoskeletal: Full ROM all peripheral extremities, joint stability, 5/5 strength, and normal gait. Skin: Warm and dry without rashes, lesions, cyanosis, clubbing or  ecchymosis.  Neuro: Cranial nerves intact, reflexes equal bilaterally. Normal muscle tone, no cerebellar symptoms. Sensation intact.  Pysch: Alert and oriented X 3 with normal affect, insight and judgment appropriate.   Assessment and Plan  1. Annual Preventative/Screening Exam  2. Labile hypertension  - EKG 12-Lead - Urinalysis, Routine w reflex microscopic - Microalbumin / creatinine urine ratio - CBC with Differential/Platelet - BASIC METABOLIC PANEL WITH GFR - Magnesium - TSH  3. Hyperlipidemia, mixed  - EKG 12-Lead - Hepatic function panel - Lipid panel - TSH  4. Screening for diabetes mellitus  - Hemoglobin A1c - Insulin, random  5. Vitamin D deficiency  - VITAMIN D 25 Hydroxy  6. Screening for colorectal cancer  - POC Hemoccult Bld/Stl  7. Prostate cancer screening  - PSA  8. Screening for ischemic heart disease  - EKG 12-Lead  9. Fatigue, unspecified type ** - Iron,Total/Total Iron Binding Cap -  Vitamin B12 - Testosterone - CBC with Differential/Platelet  10. Medication management  - Urinalysis, Routine w reflex microscopic - Microalbumin / creatinine urine ratio - CBC with Differential/Platelet - BASIC METABOLIC PANEL WITH GFR - Hepatic function panel - Magnesium - Lipid panel - TSH - Hemoglobin A1c - Insulin, random - VITAMIN D 25 Hydroxy  11. Need for prophylactic vaccination with tetanus-diphtheria (Td)  - Td : Tetanus/diphtheria >7yo Preservative  free  12. Screening examination for pulmonary tuberculosis  - PPD      Patient was counseled in prudent diet,  weight control to achieve/maintain BMI less than 25, BP monitoring, regular exercise and medications as discussed.  Discussed med effects and SE's. Routine screening labs and tests as requested with regular follow-up as recommended. Over 40 minutes of exam, counseling, chart review and high complex critical decision making was performed

## 2017-05-04 LAB — URINALYSIS, ROUTINE W REFLEX MICROSCOPIC
BACTERIA UA: NONE SEEN /HPF
Bilirubin Urine: NEGATIVE
GLUCOSE, UA: NEGATIVE
HYALINE CAST: NONE SEEN /LPF
Ketones, ur: NEGATIVE
Leukocytes, UA: NEGATIVE
Nitrite: NEGATIVE
Protein, ur: NEGATIVE
Specific Gravity, Urine: 1.017 (ref 1.001–1.03)
Squamous Epithelial / LPF: NONE SEEN /HPF (ref <=5)
WBC, UA: NONE SEEN /HPF (ref 0–5)

## 2017-05-04 LAB — CBC WITH DIFFERENTIAL/PLATELET
BASOS PCT: 0.5 %
Basophils Absolute: 30 cells/uL (ref 0–200)
EOS ABS: 102 {cells}/uL (ref 15–500)
Eosinophils Relative: 1.7 %
HCT: 43.8 % (ref 38.5–50.0)
Hemoglobin: 15.5 g/dL (ref 13.2–17.1)
Lymphs Abs: 1620 cells/uL (ref 850–3900)
MCH: 32.4 pg (ref 27.0–33.0)
MCHC: 35.4 g/dL (ref 32.0–36.0)
MCV: 91.6 fL (ref 80.0–100.0)
MONOS PCT: 6.9 %
MPV: 10.2 fL (ref 7.5–12.5)
NEUTROS PCT: 63.9 %
Neutro Abs: 3834 cells/uL (ref 1500–7800)
PLATELETS: 254 10*3/uL (ref 140–400)
RBC: 4.78 10*6/uL (ref 4.20–5.80)
RDW: 12.5 % (ref 11.0–15.0)
TOTAL LYMPHOCYTE: 27 %
WBC: 6 10*3/uL (ref 3.8–10.8)
WBCMIX: 414 {cells}/uL (ref 200–950)

## 2017-05-04 LAB — MAGNESIUM: Magnesium: 1.9 mg/dL (ref 1.5–2.5)

## 2017-05-04 LAB — HEPATIC FUNCTION PANEL
AG RATIO: 2.3 (calc) (ref 1.0–2.5)
ALKALINE PHOSPHATASE (APISO): 90 U/L (ref 40–115)
ALT: 19 U/L (ref 9–46)
AST: 17 U/L (ref 10–40)
Albumin: 5 g/dL (ref 3.6–5.1)
BILIRUBIN INDIRECT: 0.4 mg/dL (ref 0.2–1.2)
BILIRUBIN TOTAL: 0.5 mg/dL (ref 0.2–1.2)
Bilirubin, Direct: 0.1 mg/dL (ref 0.0–0.2)
Globulin: 2.2 g/dL (calc) (ref 1.9–3.7)
Total Protein: 7.2 g/dL (ref 6.1–8.1)

## 2017-05-04 LAB — BASIC METABOLIC PANEL WITH GFR
BUN: 14 mg/dL (ref 7–25)
CO2: 29 mmol/L (ref 20–32)
CREATININE: 0.95 mg/dL (ref 0.60–1.35)
Calcium: 9.6 mg/dL (ref 8.6–10.3)
Chloride: 105 mmol/L (ref 98–110)
GFR, EST NON AFRICAN AMERICAN: 96 mL/min/{1.73_m2} (ref >=60)
GFR, Est African American: 111 mL/min/{1.73_m2} (ref >=60)
GLUCOSE: 82 mg/dL (ref 65–99)
POTASSIUM: 4.5 mmol/L (ref 3.5–5.3)
SODIUM: 143 mmol/L (ref 135–146)

## 2017-05-04 LAB — LIPID PANEL
Cholesterol: 229 mg/dL — ABNORMAL HIGH (ref <200)
HDL: 41 mg/dL (ref >40)
LDL Cholesterol (Calc): 168 mg/dL (calc) — ABNORMAL HIGH
NON-HDL CHOLESTEROL (CALC): 188 mg/dL — AB (ref <130)
Total CHOL/HDL Ratio: 5.6 (calc) — ABNORMAL HIGH (ref <5.0)
Triglycerides: 96 mg/dL (ref <150)

## 2017-05-04 LAB — IRON, TOTAL/TOTAL IRON BINDING CAP
%SAT: 24 % (ref 15–60)
IRON: 98 ug/dL (ref 50–180)
TIBC: 403 mcg/dL (calc) (ref 250–425)

## 2017-05-04 LAB — HEMOGLOBIN A1C
EAG (MMOL/L): 5.2 (calc)
HEMOGLOBIN A1C: 4.9 %{Hb} (ref <5.7)
MEAN PLASMA GLUCOSE: 94 (calc)

## 2017-05-04 LAB — TSH: TSH: 2.03 m[IU]/L (ref 0.40–4.50)

## 2017-05-04 LAB — TESTOSTERONE: Testosterone: 291 ng/dL (ref 250–827)

## 2017-05-04 LAB — INSULIN, RANDOM: INSULIN: 9.9 u[IU]/mL (ref 2.0–19.6)

## 2017-05-04 LAB — VITAMIN B12: VITAMIN B 12: 346 pg/mL (ref 200–1100)

## 2017-05-04 LAB — MICROALBUMIN / CREATININE URINE RATIO
CREATININE, URINE: 93 mg/dL (ref 20–320)
MICROALB/CREAT RATIO: 12 ug/mg{creat} (ref <30)
Microalb, Ur: 1.1 mg/dL

## 2017-05-04 LAB — VITAMIN D 25 HYDROXY (VIT D DEFICIENCY, FRACTURES): Vit D, 25-Hydroxy: 31 ng/mL (ref 30–100)

## 2017-05-04 LAB — PSA: PSA: 1.4 ng/mL (ref <=4.0)

## 2017-05-10 ENCOUNTER — Encounter: Payer: Self-pay | Admitting: Internal Medicine

## 2017-05-10 LAB — TB SKIN TEST
INDURATION: 0 mm
TB Skin Test: NEGATIVE

## 2017-08-09 DIAGNOSIS — E669 Obesity, unspecified: Secondary | ICD-10-CM | POA: Insufficient documentation

## 2017-08-09 NOTE — Progress Notes (Signed)
FOLLOW UP  Assessment and Plan:   Hypertension Well controlled at this time without medication  Monitor blood pressure at home; patient to call if consistently greater than 130/80 Continue DASH diet.   Reminder to go to the ER if any CP, SOB, nausea, dizziness, severe HA, changes vision/speech, left arm numbness and tingling and jaw pain.  Cholesterol Currently above goal - was increased to 40 mg atorvastatin daily at the last visit Continue low cholesterol diet and exercise.  Check lipid panel.   Obesity with co morbidities Long discussion about weight loss, diet, and exercise - he is currently demonstrating successful gradual weight loss by lifestyle modification Recommended diet heavy in fruits and veggies and low in animal meats, cheeses, and dairy products, appropriate calorie intake Discussed ideal weight for height Patient will work on continue current good diet and avoidance of alcohol  Will follow up in 3 months  Vitamin D Def Below goal at last visit but has not changed dose; advised to double dose to 4000 U daily continue supplementation to maintain goal of 70-100 Defer Vit D level  Continue diet and meds as discussed. Further disposition pending results of labs. Discussed med's effects and SE's.   Over 30 minutes of exam, counseling, chart review, and critical decision making was performed.   Future Appointments  Date Time Provider Department Center  11/11/2017  3:30 PM Lucky Cowboy, MD [facility] None  06/13/2018  3:00 PM Lucky Cowboy, MD [facility] None    ----------------------------------------------------------------------------------------------------------------------  HPI 47 y.o. male  presents for 3 month follow up on hypertension, cholesterol, glucose management, obesity and vitamin D deficiency.   BMI is Body mass index is 30.37 kg/m., he has been working on diet and has cut out alcohol (prviously 4 beers daily) - he does not exercise. He does  work a physically intense job in Production designer, theatre/television/film.  Wt Readings from Last 3 Encounters:  08/10/17 191 lb (86.6 kg)  05/03/17 197 lb 6.4 oz (89.5 kg)  04/27/16 206 lb (93.4 kg)   Today their BP is BP: 110/76  He does not workout. He denies chest pain, shortness of breath, dizziness.   He is on cholesterol medication - he increased dose from 40 mg three times weekly to daily after the last visit and denies myalgias. His cholesterol is not at goal. The cholesterol last visit was:   Lab Results  Component Value Date   CHOL 229 (H) 05/03/2017   HDL 41 05/03/2017   LDLCALC 159 (H) 04/27/2016   TRIG 96 05/03/2017   CHOLHDL 5.6 (H) 05/03/2017    He has been working on diet and exercise for glucose management, and denies increased appetite, nausea, paresthesia of the feet, polydipsia, polyuria, visual disturbances and vomiting. Last A1C in the office was:  Lab Results  Component Value Date   HGBA1C 4.9 05/03/2017   Patient is on Vitamin D supplement but well below goal of 70 at the last check:    Lab Results  Component Value Date   VD25OH 31 05/03/2017        Current Medications:  Current Outpatient Medications on File Prior to Visit  Medication Sig  . aspirin 81 MG tablet Take 81 mg by mouth daily.  Marland Kitchen atorvastatin (LIPITOR) 80 MG tablet Take 1/2 tablet to 1 tablet every day for cholesterol  . Cholecalciferol (VITAMIN D) 2000 units CAPS Take 1 capsule by mouth daily.   No current facility-administered medications on file prior to visit.      Allergies: No  Known Allergies   Medical History:  Past Medical History:  Diagnosis Date  . Hyperlipidemia   . Hypertension   . Hypogonadism male    Family history- Reviewed and unchanged Social history- Reviewed and unchanged   Review of Systems:  Review of Systems  Constitutional: Negative for malaise/fatigue and weight loss.  HENT: Negative for hearing loss and tinnitus.   Eyes: Negative for blurred vision and double vision.   Respiratory: Negative for cough, shortness of breath and wheezing.   Cardiovascular: Negative for chest pain, palpitations, orthopnea, claudication and leg swelling.  Gastrointestinal: Negative for abdominal pain, blood in stool, constipation, diarrhea, heartburn, melena, nausea and vomiting.  Genitourinary: Negative.   Musculoskeletal: Negative for joint pain and myalgias.  Skin: Negative for rash.  Neurological: Negative for dizziness, tingling, sensory change, weakness and headaches.  Endo/Heme/Allergies: Negative for polydipsia.  Psychiatric/Behavioral: Negative.   All other systems reviewed and are negative.     Physical Exam: BP 110/76   Pulse 89   Temp (!) 97.3 F (36.3 C)   Ht 5' 6.5" (1.689 m)   Wt 191 lb (86.6 kg)   SpO2 97%   BMI 30.37 kg/m  Wt Readings from Last 3 Encounters:  08/10/17 191 lb (86.6 kg)  05/03/17 197 lb 6.4 oz (89.5 kg)  04/27/16 206 lb (93.4 kg)   General Appearance: Well nourished, in no apparent distress. Eyes: PERRLA, EOMs, conjunctiva no swelling or erythema Sinuses: No Frontal/maxillary tenderness ENT/Mouth: Ext aud canals clear, TMs without erythema, bulging. No erythema, swelling, or exudate on post pharynx.  Tonsils not swollen or erythematous. Hearing normal.  Neck: Supple, thyroid normal.  Respiratory: Respiratory effort normal, BS equal bilaterally without rales, rhonchi, wheezing or stridor.  Cardio: RRR with no MRGs. Brisk peripheral pulses without edema.  Abdomen: Soft, + BS.  Non tender, no guarding, rebound, hernias, masses. Lymphatics: Non tender without lymphadenopathy.  Musculoskeletal: Full ROM, 5/5 strength, Normal gait Skin: Warm, dry without rashes, lesions, ecchymosis.  Neuro: Cranial nerves intact. No cerebellar symptoms.  Psych: Awake and oriented X 3, normal affect, Insight and Judgment appropriate.    Dan MakerAshley C Dwayna Kentner, NP 3:14 PM [facility]

## 2017-08-10 ENCOUNTER — Ambulatory Visit (INDEPENDENT_AMBULATORY_CARE_PROVIDER_SITE_OTHER): Payer: BLUE CROSS/BLUE SHIELD | Admitting: Adult Health

## 2017-08-10 ENCOUNTER — Encounter: Payer: Self-pay | Admitting: Adult Health

## 2017-08-10 VITALS — BP 110/76 | HR 89 | Temp 97.3°F | Ht 66.5 in | Wt 191.0 lb

## 2017-08-10 DIAGNOSIS — E669 Obesity, unspecified: Secondary | ICD-10-CM | POA: Diagnosis not present

## 2017-08-10 DIAGNOSIS — E559 Vitamin D deficiency, unspecified: Secondary | ICD-10-CM

## 2017-08-10 DIAGNOSIS — I10 Essential (primary) hypertension: Secondary | ICD-10-CM

## 2017-08-10 DIAGNOSIS — E782 Mixed hyperlipidemia: Secondary | ICD-10-CM | POA: Diagnosis not present

## 2017-08-10 DIAGNOSIS — Z79899 Other long term (current) drug therapy: Secondary | ICD-10-CM

## 2017-08-10 NOTE — Patient Instructions (Signed)
Here is some information to help you keep your heart healthy: Move it! - Aim for 30 mins of activity every day. Take it slowly at first. Talk to [place] before starting any new exercise program.   Lose it.  -Body Mass Index (BMI) can indicate if you need to lose weight. A healthy range is 18.5-24.9. For a BMI calculator, go to [employer]  Waist Management -Excess abdominal fat is a risk factor for heart disease, diabetes, asthma, stroke and more. Ideal waist circumference is less than 35" for women and less than 40" for men.   Eat Right -focus on fruits, vegetables, whole grains, and meals you make yourself. Avoid foods with trans fat and high sugar/sodium content.   Snooze or Snore? - Loud snoring can be a sign of sleep apnea, a significant risk factor for high blood pressure, heart attach, stroke, and heart arrhythmias.  Kick the habit -Quit Smoking! Avoid second hand smoke. A single cigarette raises your blood pressure for 20 mins and increases the risk of heart attack and stroke for the next 24 hours.   Are Aspirin and Supplements right for you? -Add ENTERIC COATED low dose 81 mg Aspirin daily OR can do every other day if you have easy bruising to protect your heart and head. As well as to reduce risk of Colon Cancer by 20 %, Skin Cancer by 26 % , Melanoma by 46% and Pancreatic cancer by 60%  Say "No to Stress -There may be little you can do about problems that cause stress. However, techniques such as long walks, meditation, and exercise can help you manage it.   Start Now! - Make changes one at a time and set reasonable goals to increase your likelihood of success.      Fat and Cholesterol Restricted Diet Getting too much fat and cholesterol in your diet may cause health problems. Following this diet helps keep your fat and cholesterol at normal levels. This can keep you from getting sick. What types of fat should I choose?  Choose monosaturated and polyunsaturated fats. These  are found in foods such as olive oil, canola oil, flaxseeds, walnuts, almonds, and seeds.  Eat more omega-3 fats. Good choices include salmon, mackerel, sardines, tuna, flaxseed oil, and ground flaxseeds.  Limit saturated fats. These are in animal products such as meats, butter, and cream. They can also be in plant products such as palm oil, palm kernel oil, and coconut oil.  Avoid foods with partially hydrogenated oils in them. These contain trans fats. Examples of foods that have trans fats are stick margarine, some tub margarines, cookies, crackers, and other baked goods. What general guidelines do I need to follow?  Check food labels. Look for the words "trans fat" and "saturated fat."  When preparing a meal: ? Fill half of your plate with vegetables and green salads. ? Fill one fourth of your plate with whole grains. Look for the word "whole" as the first word in the ingredient list. ? Fill one fourth of your plate with lean protein foods.  Eat more foods that have fiber, like apples, carrots, beans, peas, and barley.  Eat more home-cooked foods. Eat less at restaurants and buffets.  Limit or avoid alcohol.  Limit foods high in starch and sugar.  Limit fried foods.  Cook foods without frying them. Baking, boiling, grilling, and broiling are all great options.  Lose weight if you are overweight. Losing even a small amount of weight can help your overall health. It can  also help prevent diseases such as diabetes and heart disease. What foods can I eat? Grains Whole grains, such as whole wheat or whole grain breads, crackers, cereals, and pasta. Unsweetened oatmeal, bulgur, barley, quinoa, or brown rice. Corn or whole wheat flour tortillas. Vegetables Fresh or frozen vegetables (raw, steamed, roasted, or grilled). Green salads. Fruits All fresh, canned (in natural juice), or frozen fruits. Meat and Other Protein Products Ground beef (85% or leaner), grass-fed beef, or beef  trimmed of fat. Skinless chicken or [place]. Ground chicken or [place]. Pork trimmed of fat. All fish and seafood. Eggs. Dried beans, peas, or lentils. Unsalted nuts or seeds. Unsalted canned or dry beans. Dairy Low-fat dairy products, such as skim or 1% milk, 2% or reduced-fat cheeses, low-fat ricotta or cottage cheese, or plain low-fat yogurt. Fats and Oils Tub margarines without trans fats. Light or reduced-fat mayonnaise and salad dressings. Avocado. Olive, canola, sesame, or safflower oils. Natural peanut or almond butter (choose ones without added sugar and oil). The items listed above may not be a complete list of recommended foods or beverages. Contact your dietitian for more options. What foods are not recommended? Grains White bread. White pasta. White rice. Cornbread. Bagels, pastries, and croissants. Crackers that contain trans fat. Vegetables White potatoes. Corn. Creamed or fried vegetables. Vegetables in a cheese sauce. Fruits Dried fruits. Canned fruit in light or heavy syrup. Fruit juice. Meat and Other Protein Products Fatty cuts of meat. Ribs, chicken wings, bacon, sausage, bologna, salami, chitterlings, fatback, hot dogs, bratwurst, and packaged luncheon meats. Liver and organ meats. Dairy Whole or 2% milk, cream, half-and-half, and cream cheese. Whole milk cheeses. Whole-fat or sweetened yogurt. Full-fat cheeses. Nondairy creamers and whipped toppings. Processed cheese, cheese spreads, or cheese curds. Sweets and Desserts Corn syrup, sugars, honey, and molasses. Candy. Jam and jelly. Syrup. Sweetened cereals. Cookies, pies, cakes, donuts, muffins, and ice cream. Fats and Oils Butter, stick margarine, lard, shortening, ghee, or bacon fat. Coconut, palm kernel, or palm oils. Beverages Alcohol. Sweetened drinks (such as sodas, lemonade, and fruit drinks or punches). The items listed above may not be a complete list of foods and beverages to avoid. Contact your dietitian for  more information. This information is not intended to replace advice given to you by your health care provider. Make sure you discuss any questions you have with your health care provider. Document Released: 12/29/2011 Document Revised: 03/05/2016 Document Reviewed: 09/28/2013 Elsevier Interactive Patient Education  [employer]2018 [employer]

## 2017-08-11 LAB — HEPATIC FUNCTION PANEL
AG Ratio: 1.7 (calc) (ref 1.0–2.5)
ALT: 19 U/L (ref 9–46)
AST: 18 U/L (ref 10–40)
Albumin: 4.7 g/dL (ref 3.6–5.1)
Alkaline phosphatase (APISO): 96 U/L (ref 40–115)
BILIRUBIN DIRECT: 0.1 mg/dL (ref 0.0–0.2)
BILIRUBIN INDIRECT: 0.4 mg/dL (ref 0.2–1.2)
GLOBULIN: 2.7 g/dL (ref 1.9–3.7)
Total Bilirubin: 0.5 mg/dL (ref 0.2–1.2)
Total Protein: 7.4 g/dL (ref 6.1–8.1)

## 2017-08-11 LAB — BASIC METABOLIC PANEL WITH GFR
BUN: 13 mg/dL (ref 7–25)
CHLORIDE: 104 mmol/L (ref 98–110)
CO2: 28 mmol/L (ref 20–32)
Calcium: 9.8 mg/dL (ref 8.6–10.3)
Creat: 0.89 mg/dL (ref 0.60–1.35)
GFR, EST AFRICAN AMERICAN: 119 mL/min/{1.73_m2} (ref >=60)
GFR, Est Non African American: 103 mL/min/{1.73_m2} (ref >=60)
Glucose, Bld: 76 mg/dL (ref 65–99)
Potassium: 4.5 mmol/L (ref 3.5–5.3)
SODIUM: 142 mmol/L (ref 135–146)

## 2017-08-11 LAB — CBC WITH DIFFERENTIAL/PLATELET
BASOS ABS: 48 {cells}/uL (ref 0–200)
BASOS PCT: 0.7 %
EOS ABS: 138 {cells}/uL (ref 15–500)
Eosinophils Relative: 2 %
HCT: 44.2 % (ref 38.5–50.0)
Hemoglobin: 15.3 g/dL (ref 13.2–17.1)
Lymphs Abs: 1753 cells/uL (ref 850–3900)
MCH: 30.7 pg (ref 27.0–33.0)
MCHC: 34.6 g/dL (ref 32.0–36.0)
MCV: 88.8 fL (ref 80.0–100.0)
MPV: 9.7 fL (ref 7.5–12.5)
Monocytes Relative: 7.4 %
NEUTROS PCT: 64.5 %
Neutro Abs: 4451 cells/uL (ref 1500–7800)
PLATELETS: 265 10*3/uL (ref 140–400)
RBC: 4.98 10*6/uL (ref 4.20–5.80)
RDW: 13.4 % (ref 11.0–15.0)
TOTAL LYMPHOCYTE: 25.4 %
WBC: 6.9 10*3/uL (ref 3.8–10.8)
WBCMIX: 511 {cells}/uL (ref 200–950)

## 2017-08-11 LAB — LIPID PANEL
CHOL/HDL RATIO: 5.7 (calc) — AB (ref <5.0)
Cholesterol: 232 mg/dL — ABNORMAL HIGH (ref <200)
HDL: 41 mg/dL (ref >40)
LDL Cholesterol (Calc): 167 mg/dL (calc) — ABNORMAL HIGH
NON-HDL CHOLESTEROL (CALC): 191 mg/dL — AB (ref <130)
Triglycerides: 116 mg/dL (ref <150)

## 2017-08-11 LAB — TSH: TSH: 2.41 mIU/L (ref 0.40–4.50)

## 2017-11-11 ENCOUNTER — Encounter: Payer: Self-pay | Admitting: Internal Medicine

## 2017-11-11 ENCOUNTER — Ambulatory Visit (INDEPENDENT_AMBULATORY_CARE_PROVIDER_SITE_OTHER): Payer: BLUE CROSS/BLUE SHIELD | Admitting: Internal Medicine

## 2017-11-11 VITALS — BP 124/80 | HR 76 | Temp 97.7°F | Resp 20 | Ht 66.5 in | Wt 188.6 lb

## 2017-11-11 DIAGNOSIS — E782 Mixed hyperlipidemia: Secondary | ICD-10-CM | POA: Diagnosis not present

## 2017-11-11 DIAGNOSIS — I10 Essential (primary) hypertension: Secondary | ICD-10-CM | POA: Diagnosis not present

## 2017-11-11 DIAGNOSIS — Z131 Encounter for screening for diabetes mellitus: Secondary | ICD-10-CM

## 2017-11-11 DIAGNOSIS — Z9109 Other allergy status, other than to drugs and biological substances: Secondary | ICD-10-CM | POA: Diagnosis not present

## 2017-11-11 DIAGNOSIS — E559 Vitamin D deficiency, unspecified: Secondary | ICD-10-CM

## 2017-11-11 DIAGNOSIS — Z79899 Other long term (current) drug therapy: Secondary | ICD-10-CM | POA: Diagnosis not present

## 2017-11-11 LAB — CBC WITH DIFFERENTIAL/PLATELET
Basophils Absolute: 29 cells/uL (ref 0–200)
Basophils Relative: 0.4 %
EOS PCT: 1.4 %
Eosinophils Absolute: 101 cells/uL (ref 15–500)
HEMATOCRIT: 43 % (ref 38.5–50.0)
Hemoglobin: 15.4 g/dL (ref 13.2–17.1)
LYMPHS ABS: 1865 {cells}/uL (ref 850–3900)
MCH: 32.8 pg (ref 27.0–33.0)
MCHC: 35.8 g/dL (ref 32.0–36.0)
MCV: 91.7 fL (ref 80.0–100.0)
MPV: 9.8 fL (ref 7.5–12.5)
Monocytes Relative: 7.1 %
NEUTROS PCT: 65.2 %
Neutro Abs: 4694 cells/uL (ref 1500–7800)
PLATELETS: 238 10*3/uL (ref 140–400)
RBC: 4.69 10*6/uL (ref 4.20–5.80)
RDW: 13.2 % (ref 11.0–15.0)
Total Lymphocyte: 25.9 %
WBC mixed population: 511 cells/uL (ref 200–950)
WBC: 7.2 10*3/uL (ref 3.8–10.8)

## 2017-11-11 LAB — TSH: TSH: 2.39 mIU/L (ref 0.40–4.50)

## 2017-11-11 MED ORDER — MONTELUKAST SODIUM 10 MG PO TABS: ORAL_TABLET | ORAL | 3 refills | Status: DC

## 2017-11-11 MED ORDER — PREDNISONE 20 MG PO TABS: ORAL_TABLET | ORAL | 0 refills | Status: DC

## 2017-11-11 NOTE — Patient Instructions (Signed)

## 2017-11-11 NOTE — Progress Notes (Signed)
This very nice 47 y.o. MWM presents for 6 month follow up with HTN, HLD, Pre-Diabetes and Vitamin D Deficiency.      Patient is treated for HTN & BP has been controlled at home. Today's BP is at goal -  124/80. Patient has had no complaints of any cardiac type chest pain, palpitations, dyspnea / orthopnea / PND, dizziness, claudication, or dependent edema.     Hyperlipidemia is not controlled with diet & meds. Patient denies myalgias or other med SE's. Last Lipids were not at goal: Lab Results  Component Value Date   CHOL 232 (H) 08/10/2017   HDL 41 08/10/2017   LDLCALC 167 (H) 08/10/2017   TRIG 116 08/10/2017   CHOLHDL 5.7 (H) 08/10/2017      Also, the patient has history of PreDiabetes and has had no symptoms of reactive hypoglycemia, diabetic polys, paresthesias or visual blurring.  Last A1c was Normal & at goal: Lab Results  Component Value Date   HGBA1C 4.9 05/03/2017      Patient has hx/o low Testosterone with low level of "218" in 2009. Further, the patient also has history of Vitamin D Deficiency ("27"/2008)  and supplements vitamin D without any suspected side-effects. Last vitamin D was still very low : Lab Results  Component Value Date   VD25OH 31 05/03/2017   Current Outpatient Medications on File Prior to Visit  Medication Sig  . Ascorbic Acid (VITA-C PO) Take by mouth.  Marland Kitchen aspirin 81 MG tablet Take 81 mg by mouth daily.  Marland Kitchen atorvastatin (LIPITOR) 80 MG tablet Take 1/2 tablet to 1 tablet every day for cholesterol  . Cholecalciferol (VITAMIN D) 2000 units CAPS Take 2 capsules by mouth daily.  Marland Kitchen OVER THE COUNTER MEDICATION    No current facility-administered medications on file prior to visit.    No Known Allergies   PMHx:   Past Medical History:  Diagnosis Date  . Hyperlipidemia   . Hypertension   . Hypogonadism male    Immunization History  Administered Date(s) Administered  . PPD Test 04/05/2014, 05/03/2017  . Pneumococcal Polysaccharide-23 11/29/2008    . Td 05/03/2017  . Tdap 09/16/2006   FHx:    Reviewed / unchanged  SHx:    Reviewed / unchanged  \ Systems Review:  Constitutional: Denies fever, chills, wt changes, headaches, insomnia, fatigue, night sweats, change in appetite. Eyes: Denies redness, blurred vision, diplopia, discharge, itchy, watery eyes.  ENT: Denies discharge, congestion, post nasal drip, epistaxis, sore throat, earache, hearing loss, dental pain, tinnitus, vertigo, sinus pain, snoring.  CV: Denies chest pain, palpitations, irregular heartbeat, syncope, dyspnea, diaphoresis, orthopnea, PND, claudication or edema. Respiratory: denies cough, dyspnea, DOE, pleurisy, hoarseness, laryngitis, wheezing.  Gastrointestinal: Denies dysphagia, odynophagia, heartburn, reflux, water brash, abdominal pain or cramps, nausea, vomiting, bloating, diarrhea, constipation, hematemesis, melena, hematochezia  or hemorrhoids. Genitourinary: Denies dysuria, frequency, urgency, nocturia, hesitancy, discharge, hematuria or flank pain. Musculoskeletal: Denies arthralgias, myalgias, stiffness, jt. swelling, pain, limping or strain/sprain.  Skin: Denies pruritus, rash, hives, warts, acne, eczema or change in skin lesion(s). Neuro: No weakness, tremor, incoordination, spasms, paresthesia or pain. Psychiatric: Denies confusion, memory loss or sensory loss. Endo: Denies change in weight, skin or hair change.  Heme/Lymph: No excessive bleeding, bruising or enlarged lymph nodes.  Physical Exam  BP 124/80   Pulse 76   Temp 97.7 F (36.5 C)   Resp 20   Ht 5' 6.5" (1.689 m)   Wt 188 lb 9.6 oz (85.5 kg)  SpO2 96%   BMI 29.98 kg/m   Appears  well nourished, well groomed  and in no distress.  Eyes: PERRLA, EOMs, conjunctiva no swelling or erythema. Sinuses: No frontal/maxillary tenderness ENT/Mouth: EAC's clear, TM's nl w/o erythema, bulging. Nares clear w/o erythema, swelling, exudates. Oropharynx clear without erythema or exudates. Oral  hygiene is good. Tongue normal, non obstructing. Hearing intact.  Neck: Supple. Thyroid not palpable. Car 2+/2+ without bruits, nodes or JVD. Chest: Respirations nl with BS clear & equal w/o rales, rhonchi, wheezing or stridor.  Cor: Heart sounds normal w/ regular rate and rhythm without sig. murmurs, gallops, clicks or rubs. Peripheral pulses normal and equal  without edema.  Abdomen: Soft & bowel sounds normal. Non-tender w/o guarding, rebound, hernias, masses or organomegaly.  Lymphatics: Unremarkable.  Musculoskeletal: Full ROM all peripheral extremities, joint stability, 5/5 strength and normal gait.  Skin: Warm, dry without exposed rashes, lesions or ecchymosis apparent.  Neuro: Cranial nerves intact, reflexes equal bilaterally. Sensory-motor testing grossly intact. Tendon reflexes grossly intact.  Pysch: Alert & oriented x 3.  Insight and judgement nl & appropriate. No ideations.  Assessment and Plan:  1. Essential hypertension  - Continue medication, monitor blood pressure at home.  - Continue DASH diet.  Reminder to go to the ER if any CP,  SOB, nausea, dizziness, severe HA, changes vision/speech.  - CBC with Differential/Platelet - COMPLETE METABOLIC PANEL WITH GFR - Magnesium - TSH  2. Hyperlipidemia, mixed  - Continue diet/meds, exercise,& lifestyle modifications.  - Continue monitor periodic cholesterol/liver & renal functions   - Lipid panel - TSH  3. Screening for diabetes mellitus  - Continue diet, exercise, lifestyle modifications.  - Monitor appropriate labs.  - Hemoglobin A1c - Insulin, random  4. Vitamin D deficiency  - Continue supplementation.  - VITAMIN D 25 Hydroxyl  5. Medication management  - CBC with Differential/Platelet - COMPLETE METABOLIC PANEL WITH GFR - Magnesium - Lipid panel - TSH - Hemoglobin A1c - Insulin, random - VITAMIN D 25 Hydroxyl  6. Environmental allergies  - montelukast (SINGULAIR) 10 MG tablet; Take 1 tablet  daily for Allergies  Dispense: 90 tablet; Refill: 3  - predniSONE  20 MG tablet; 1 tab 3 x day for 3 days, then 1 tab 2 x day for 3 days, then 1 tab 1 x day for 5 days  Dispense: 20 tablet;           Discussed  regular exercise, BP monitoring, weight control to achieve/maintain BMI less than 25 and discussed med and SE's. Recommended labs to assess and monitor clinical status with further disposition pending results of labs. Over 30 minutes of exam, counseling, chart review was performed.

## 2017-11-12 ENCOUNTER — Other Ambulatory Visit: Payer: Self-pay | Admitting: *Deleted

## 2017-11-12 ENCOUNTER — Telehealth: Payer: Self-pay | Admitting: Internal Medicine

## 2017-11-12 LAB — LIPID PANEL
CHOLESTEROL: 204 mg/dL — AB (ref <200)
HDL: 40 mg/dL — ABNORMAL LOW (ref >40)
LDL CHOLESTEROL (CALC): 141 mg/dL — AB
Non-HDL Cholesterol (Calc): 164 mg/dL (calc) — ABNORMAL HIGH (ref <130)
TRIGLYCERIDES: 111 mg/dL (ref <150)
Total CHOL/HDL Ratio: 5.1 (calc) — ABNORMAL HIGH (ref <5.0)

## 2017-11-12 LAB — COMPLETE METABOLIC PANEL WITH GFR
AG Ratio: 2.3 (calc) (ref 1.0–2.5)
ALBUMIN MSPROF: 5 g/dL (ref 3.6–5.1)
ALKALINE PHOSPHATASE (APISO): 97 U/L (ref 40–115)
ALT: 18 U/L (ref 9–46)
AST: 18 U/L (ref 10–40)
BUN: 17 mg/dL (ref 7–25)
CO2: 29 mmol/L (ref 20–32)
CREATININE: 0.95 mg/dL (ref 0.60–1.35)
Calcium: 9.5 mg/dL (ref 8.6–10.3)
Chloride: 104 mmol/L (ref 98–110)
GFR, Est African American: 111 mL/min/{1.73_m2} (ref >=60)
GFR, Est Non African American: 96 mL/min/{1.73_m2} (ref >=60)
GLUCOSE: 85 mg/dL (ref 65–99)
Globulin: 2.2 g/dL (calc) (ref 1.9–3.7)
Potassium: 4.1 mmol/L (ref 3.5–5.3)
Sodium: 140 mmol/L (ref 135–146)
Total Bilirubin: 0.8 mg/dL (ref 0.2–1.2)
Total Protein: 7.2 g/dL (ref 6.1–8.1)

## 2017-11-12 LAB — VITAMIN D 25 HYDROXY (VIT D DEFICIENCY, FRACTURES): Vit D, 25-Hydroxy: 52 ng/mL (ref 30–100)

## 2017-11-12 LAB — HEMOGLOBIN A1C
HEMOGLOBIN A1C: 4.9 %{Hb} (ref <5.7)
Mean Plasma Glucose: 94 (calc)
eAG (mmol/L): 5.2 (calc)

## 2017-11-12 LAB — INSULIN, RANDOM: Insulin: 5.9 u[IU]/mL (ref 2.0–19.6)

## 2017-11-12 LAB — MAGNESIUM: MAGNESIUM: 1.8 mg/dL (ref 1.5–2.5)

## 2017-11-12 MED ORDER — ROSUVASTATIN CALCIUM 40 MG PO TABS: ORAL_TABLET | ORAL | 1 refills | Status: DC

## 2017-11-12 NOTE — Telephone Encounter (Signed)
patient states new cholestrol medicine was not called into [facility]. please advise

## 2017-11-14 ENCOUNTER — Encounter: Payer: Self-pay | Admitting: Internal Medicine

## 2018-03-09 ENCOUNTER — Ambulatory Visit: Payer: Self-pay | Admitting: Adult Health

## 2018-06-13 ENCOUNTER — Encounter: Payer: Self-pay | Admitting: Internal Medicine

## 2018-06-13 ENCOUNTER — Ambulatory Visit: Payer: BLUE CROSS/BLUE SHIELD | Admitting: Internal Medicine

## 2018-06-13 VITALS — BP 110/86 | HR 84 | Temp 97.5°F | Resp 16 | Ht 66.5 in | Wt 199.4 lb

## 2018-06-13 DIAGNOSIS — Z1329 Encounter for screening for other suspected endocrine disorder: Secondary | ICD-10-CM | POA: Diagnosis not present

## 2018-06-13 DIAGNOSIS — E349 Endocrine disorder, unspecified: Secondary | ICD-10-CM

## 2018-06-13 DIAGNOSIS — E782 Mixed hyperlipidemia: Secondary | ICD-10-CM

## 2018-06-13 DIAGNOSIS — Z13220 Encounter for screening for lipoid disorders: Secondary | ICD-10-CM | POA: Diagnosis not present

## 2018-06-13 DIAGNOSIS — Z136 Encounter for screening for cardiovascular disorders: Secondary | ICD-10-CM | POA: Diagnosis not present

## 2018-06-13 DIAGNOSIS — E559 Vitamin D deficiency, unspecified: Secondary | ICD-10-CM | POA: Diagnosis not present

## 2018-06-13 DIAGNOSIS — Z131 Encounter for screening for diabetes mellitus: Secondary | ICD-10-CM | POA: Diagnosis not present

## 2018-06-13 DIAGNOSIS — Z1389 Encounter for screening for other disorder: Secondary | ICD-10-CM

## 2018-06-13 DIAGNOSIS — Z87891 Personal history of nicotine dependence: Secondary | ICD-10-CM

## 2018-06-13 DIAGNOSIS — I10 Essential (primary) hypertension: Secondary | ICD-10-CM | POA: Diagnosis not present

## 2018-06-13 DIAGNOSIS — Z79899 Other long term (current) drug therapy: Secondary | ICD-10-CM | POA: Diagnosis not present

## 2018-06-13 DIAGNOSIS — Z111 Encounter for screening for respiratory tuberculosis: Secondary | ICD-10-CM

## 2018-06-13 DIAGNOSIS — R5383 Other fatigue: Secondary | ICD-10-CM

## 2018-06-13 DIAGNOSIS — Z1211 Encounter for screening for malignant neoplasm of colon: Secondary | ICD-10-CM

## 2018-06-13 DIAGNOSIS — R0989 Other specified symptoms and signs involving the circulatory and respiratory systems: Secondary | ICD-10-CM

## 2018-06-13 DIAGNOSIS — Z125 Encounter for screening for malignant neoplasm of prostate: Secondary | ICD-10-CM

## 2018-06-13 DIAGNOSIS — Z0000 Encounter for general adult medical examination without abnormal findings: Secondary | ICD-10-CM

## 2018-06-13 DIAGNOSIS — Z1212 Encounter for screening for malignant neoplasm of rectum: Secondary | ICD-10-CM

## 2018-06-13 DIAGNOSIS — Z130 Encounter for screening for diseases of the blood and blood-forming organs and certain disorders involving the immune mechanism: Secondary | ICD-10-CM | POA: Diagnosis not present

## 2018-06-13 DIAGNOSIS — E538 Deficiency of other specified B group vitamins: Secondary | ICD-10-CM | POA: Insufficient documentation

## 2018-06-13 DIAGNOSIS — Z8249 Family history of ischemic heart disease and other diseases of the circulatory system: Secondary | ICD-10-CM | POA: Insufficient documentation

## 2018-06-13 DIAGNOSIS — Z0001 Encounter for general adult medical examination with abnormal findings: Secondary | ICD-10-CM

## 2018-06-13 DIAGNOSIS — R7309 Other abnormal glucose: Secondary | ICD-10-CM

## 2018-06-13 HISTORY — DX: Endocrine disorder, unspecified: E34.9

## 2018-06-13 NOTE — Patient Instructions (Signed)

## 2018-06-13 NOTE — Progress Notes (Signed)
[facility]   Lucky Cowboy, M.D.     [facility]. [name], P.A.-C Judd Gaudier, DNP [facility]                [address]                [place], [place]. [address] Telephone [phone] Telefax [phone] Annual  Screening/Preventative Visit  & Comprehensive Evaluation & Examination     This very nice 47 y.o. MWM presents for a Screening /Preventative Visit & comprehensive evaluation and management of multiple medical co-morbidities.  Patient has been followed for HTN, HLD, Prediabetes and Vitamin D Deficiency. Patient has hx/o low Vitamin  B12  "363" in 2016, "332" in 2017 and "346" in 2018.      Labile HTN predates circa 2010 and is followed expectantly. Patient's BP has been controlled at home.  Today's BP  Is at goal - 110/86. Patient denies any cardiac symptoms as chest pain, palpitations, shortness of breath, dizziness or ankle swelling.     Patient's hyperlipidemia is controlled with diet and medications. Patient denies myalgias or other medication SE's. Last lipids were not at goal: Lab Results  Component Value Date   CHOL 204 (H) 11/11/2017   HDL 40 (L) 11/11/2017   LDLCALC 141 (H) 11/11/2017   TRIG 111 11/11/2017   CHOLHDL 5.1 (H) 11/11/2017      Patient is overweight (BMI 30+) and is monitored proactively for glucose intolerance and patient denies reactive hypoglycemic symptoms, visual blurring, diabetic polys or paresthesias. Last A1c was Normal & at goal: Lab Results  Component Value Date   HGBA1C 4.9 11/11/2017       Patient has hx/o Low Testosterone "243" in 2016, "291" in 2018  and denies any issues with ED, undue fatigue or decreased libido.      Finally, patient has history of Vitamin D Deficiency ("27" / 2008 and "32" / 2017" and "15" / 2018)   and last vitamin D was still low: Lab Results  Component Value Date   VD25OH 33 11/11/2017   Current Outpatient Medications on File Prior to  Visit  Medication Sig  . Ascorbic Acid (VITA-C PO) Take by mouth.  Marland Kitchen aspirin 81 MG tablet Take 81 mg by mouth daily.  . Cholecalciferol (VITAMIN D) 2000 units CAPS Take 2 capsules by mouth daily.  . Magnesium 200 MG TABS Take 1 tablet by mouth daily.  Marland Kitchen OVER THE COUNTER MEDICATION Takes Tri-B 1 daily.  . rosuvastatin (CRESTOR) 40 MG tablet Takes 1/2 to 1 tablet daily for cholesterol   No current facility-administered medications on file prior to visit.    No Known Allergies   Past Medical History:  Diagnosis Date  . Hyperlipidemia   . Hypertension   . Hypogonadism male    Health Maintenance  Topic Date Due  . HIV Screening  12/10/1985  . INFLUENZA VACCINE  02/10/2018  . TETANUS/TDAP  05/04/2027   Immunization History  Administered Date(s) Administered  . Influenza-Unspecified 05/04/2018  . PPD Test 04/05/2014, 05/03/2017  . Pneumococcal Polysaccharide-23 11/29/2008  . Td 05/03/2017  . Tdap 09/16/2006   History reviewed. No pertinent surgical history.   Family History  Problem Relation Age of Onset  . Cancer Mother        thyroid  . Heart disease Father   . Hypertension Father   . Hyperlipidemia Father    Social History   Socioeconomic History  . Marital status: Single  Spouse name: Not on file  . Number of children: Not on file  . Years of education: Not on file  . Highest education level: Not on file  Occupational History  . Not on file  Tobacco Use  . Smoking status: Former Smoker    Last attempt to quit: 06/13/1993    Years since quitting: 25.0  . Smokeless tobacco: Current User    Types: Chew  Substance and Sexual Activity  . Alcohol use: Yes    Alcohol/week: 1.0 standard drinks    Types: 1 drink(s) per week  . Drug use: No  . Sexual activity: Active    ROS Constitutional: Denies fever, chills, weight loss/gain, headaches, insomnia,  night sweats or change in appetite. Does c/o fatigue. Eyes: Denies redness, blurred vision, diplopia, discharge,  itchy or watery eyes.  ENT: Denies discharge, congestion, post nasal drip, epistaxis, sore throat, earache, hearing loss, dental pain, Tinnitus, Vertigo, Sinus pain or snoring.  Cardio: Denies chest pain, palpitations, irregular heartbeat, syncope, dyspnea, diaphoresis, orthopnea, PND, claudication or edema Respiratory: denies cough, dyspnea, DOE, pleurisy, hoarseness, laryngitis or wheezing.  Gastrointestinal: Denies dysphagia, heartburn, reflux, water brash, pain, cramps, nausea, vomiting, bloating, diarrhea, constipation, hematemesis, melena, hematochezia, jaundice or hemorrhoids Genitourinary: Denies dysuria, frequency, urgency, nocturia, hesitancy, discharge, hematuria or flank pain Musculoskeletal: Denies arthralgia, myalgia, stiffness, Jt. Swelling, pain, limp or strain/sprain. Denies Falls. Skin: Denies puritis, rash, hives, warts, acne, eczema or change in skin lesion Neuro: No weakness, tremor, incoordination, spasms, paresthesia or pain Psychiatric: Denies confusion, memory loss or sensory loss. Denies Depression. Endocrine: Denies change in weight, skin, hair change, nocturia, and paresthesia, diabetic polys, visual blurring or hyper / hypo glycemic episodes.  Heme/Lymph: No excessive bleeding, bruising or enlarged lymph nodes.  Physical Exam  BP 110/86   Pulse 84   Temp (!) 97.5 F (36.4 C)   Resp 16   Ht 5' 6.5" (1.689 m)   Wt 199 lb 6.4 oz (90.4 kg)   BMI 31.70 kg/m   General Appearance: Well nourished and well groomed and in no apparent distress.  Eyes: PERRLA, EOMs, conjunctiva no swelling or erythema, normal fundi and vessels. Sinuses: No frontal/maxillary tenderness ENT/Mouth: EACs patent / TMs  nl. Nares clear without erythema, swelling, mucoid exudates. Oral hygiene is good. No erythema, swelling, or exudate. Tongue normal, non-obstructing. Tonsils not swollen or erythematous. Hearing normal.  Neck: Supple, thyroid not palpable. No bruits, nodes or  JVD. Respiratory: Respiratory effort normal.  BS equal and clear bilateral without rales, rhonci, wheezing or stridor. Cardio: Heart sounds are normal with regular rate and rhythm and no murmurs, rubs or gallops. Peripheral pulses are normal and equal bilaterally without edema. No aortic or femoral bruits. Chest: symmetric with normal excursions and percussion.  Abdomen: Soft, with Nl bowel sounds. Nontender, no guarding, rebound, hernias, masses, or organomegaly.  Lymphatics: Non tender without lymphadenopathy.  Genitourinary: No hernias.Testes nl. DRE - prostate nl for age - smooth & firm w/o nodules. Musculoskeletal: Full ROM all peripheral extremities, joint stability, 5/5 strength, and normal gait. Skin: Warm and dry without rashes, lesions, cyanosis, clubbing or  ecchymosis.  Neuro: Cranial nerves intact, reflexes equal bilaterally. Normal muscle tone, no cerebellar symptoms. Sensation intact.  Pysch: Alert and oriented X 3 with normal affect, insight and judgment appropriate.   Assessment and Plan  1. Annual Preventative/Screening Exam   2. Labile hypertension  - EKG 12-Lead - Urinalysis, Routine w reflex microscopic - Microalbumin / creatinine urine ratio - CBC with Differential/Platelet - COMPLETE  METABOLIC PANEL WITH GFR - Magnesium  3. Hyperlipidemia, mixed  - EKG 12-Lead - Lipid panel - TSH  4. Abnormal glucose  - EKG 12-Lead - Hemoglobin A1c - Insulin, random  5. Vitamin D deficiency  - VITAMIN D 25 Hydroxyl  6. Vitamin B12 deficiency  - Vitamin B12  7. Testosterone deficiency  - CBC with Differential/Platelet  8. Screening for colorectal cancer  - POC Hemoccult Bld/Stl  9. Prostate cancer screening   10. Screening for ischemic heart disease  - EKG 12-Lead  11. Former smoker   12. FHx: heart disease   13. Screening examination for pulmonary tuberculosis  - TB Skin Test  14. Fatigue  - Iron,Total/Total Iron Binding Cap - Vitamin  B12 - Testosterone - CBC with Differential/Platelet  15. Medication management  - Urinalysis, Routine w reflex microscopic - Microalbumin / creatinine urine ratio - CBC with Differential/Platelet - COMPLETE METABOLIC PANEL WITH GFR - Magnesium - Lipid panel - TSH - Hemoglobin A1c - Insulin, random - VITAMIN D 25 Hydroxyl       Patient was counseled in prudent diet, weight control to achieve/maintain BMI less than 25, BP monitoring, regular exercise and medications as discussed.  Discussed med effects and SE's. Routine screening labs and tests as requested with regular follow-up as recommended. Over 40 minutes of exam, counseling, chart review and high complex critical decision making was performed

## 2018-06-14 LAB — CBC WITH DIFFERENTIAL/PLATELET
BASOS PCT: 0.6 %
Basophils Absolute: 38 cells/uL (ref 0–200)
EOS ABS: 141 {cells}/uL (ref 15–500)
Eosinophils Relative: 2.2 %
HCT: 47.6 % (ref 38.5–50.0)
Hemoglobin: 16.6 g/dL (ref 13.2–17.1)
Lymphs Abs: 2144 cells/uL (ref 850–3900)
MCH: 32.4 pg (ref 27.0–33.0)
MCHC: 34.9 g/dL (ref 32.0–36.0)
MCV: 92.8 fL (ref 80.0–100.0)
MPV: 10 fL (ref 7.5–12.5)
Monocytes Relative: 9.4 %
Neutro Abs: 3475 cells/uL (ref 1500–7800)
Neutrophils Relative %: 54.3 %
Platelets: 303 10*3/uL (ref 140–400)
RBC: 5.13 10*6/uL (ref 4.20–5.80)
RDW: 12.9 % (ref 11.0–15.0)
Total Lymphocyte: 33.5 %
WBC mixed population: 602 cells/uL (ref 200–950)
WBC: 6.4 10*3/uL (ref 3.8–10.8)

## 2018-06-14 LAB — MAGNESIUM: Magnesium: 2.1 mg/dL (ref 1.5–2.5)

## 2018-06-14 LAB — HEMOGLOBIN A1C
Hgb A1c MFr Bld: 5 % of total Hgb (ref <5.7)
Mean Plasma Glucose: 97 (calc)
eAG (mmol/L): 5.4 (calc)

## 2018-06-14 LAB — URINALYSIS, ROUTINE W REFLEX MICROSCOPIC
Bacteria, UA: NONE SEEN /HPF
Bilirubin Urine: NEGATIVE
GLUCOSE, UA: NEGATIVE
HYALINE CAST: NONE SEEN /LPF
KETONES UR: NEGATIVE
Leukocytes, UA: NEGATIVE
Nitrite: NEGATIVE
Protein, ur: NEGATIVE
SPECIFIC GRAVITY, URINE: 1.022 (ref 1.001–1.03)
Squamous Epithelial / LPF: NONE SEEN /HPF (ref <=5)
WBC UA: NONE SEEN /HPF (ref 0–5)

## 2018-06-14 LAB — COMPLETE METABOLIC PANEL WITH GFR
AG Ratio: 1.7 (calc) (ref 1.0–2.5)
ALT: 36 U/L (ref 9–46)
AST: 24 U/L (ref 10–40)
Albumin: 4.9 g/dL (ref 3.6–5.1)
Alkaline phosphatase (APISO): 111 U/L (ref 40–115)
BUN: 15 mg/dL (ref 7–25)
CO2: 28 mmol/L (ref 20–32)
Calcium: 9.9 mg/dL (ref 8.6–10.3)
Chloride: 102 mmol/L (ref 98–110)
Creat: 1.05 mg/dL (ref 0.60–1.35)
GFR, Est African American: 97 mL/min/{1.73_m2} (ref >=60)
GFR, Est Non African American: 84 mL/min/{1.73_m2} (ref >=60)
Globulin: 2.9 g/dL (calc) (ref 1.9–3.7)
Glucose, Bld: 81 mg/dL (ref 65–99)
Potassium: 4.3 mmol/L (ref 3.5–5.3)
Sodium: 141 mmol/L (ref 135–146)
TOTAL PROTEIN: 7.8 g/dL (ref 6.1–8.1)
Total Bilirubin: 0.5 mg/dL (ref 0.2–1.2)

## 2018-06-14 LAB — LIPID PANEL
Cholesterol: 269 mg/dL — ABNORMAL HIGH (ref <200)
HDL: 41 mg/dL (ref >40)
LDL Cholesterol (Calc): 176 mg/dL (calc) — ABNORMAL HIGH
Non-HDL Cholesterol (Calc): 228 mg/dL (calc) — ABNORMAL HIGH (ref <130)
Total CHOL/HDL Ratio: 6.6 (calc) — ABNORMAL HIGH (ref <5.0)
Triglycerides: 305 mg/dL — ABNORMAL HIGH (ref <150)

## 2018-06-14 LAB — VITAMIN B12: Vitamin B-12: 378 pg/mL (ref 200–1100)

## 2018-06-14 LAB — IRON, TOTAL/TOTAL IRON BINDING CAP
%SAT: 26 % (ref 20–48)
Iron: 106 ug/dL (ref 50–180)
TIBC: 414 mcg/dL (calc) (ref 250–425)

## 2018-06-14 LAB — INSULIN, RANDOM: Insulin: 4.4 u[IU]/mL (ref 2.0–19.6)

## 2018-06-14 LAB — MICROALBUMIN / CREATININE URINE RATIO
Creatinine, Urine: 147 mg/dL (ref 20–320)
MICROALB UR: 2.1 mg/dL
Microalb Creat Ratio: 14 mcg/mg creat (ref <30)

## 2018-06-14 LAB — TSH: TSH: 3.53 mIU/L (ref 0.40–4.50)

## 2018-06-14 LAB — VITAMIN D 25 HYDROXY (VIT D DEFICIENCY, FRACTURES): Vit D, 25-Hydroxy: 30 ng/mL (ref 30–100)

## 2018-06-14 LAB — TESTOSTERONE: TESTOSTERONE: 181 ng/dL — AB (ref 250–827)

## 2018-06-15 LAB — TB SKIN TEST
Induration: 0 mm
TB Skin Test: NEGATIVE

## 2018-09-13 ENCOUNTER — Ambulatory Visit: Payer: Self-pay | Admitting: Adult Health

## 2018-12-19 ENCOUNTER — Ambulatory Visit: Payer: Self-pay | Admitting: Internal Medicine

## 2018-12-26 ENCOUNTER — Encounter: Payer: Self-pay | Admitting: Internal Medicine

## 2018-12-26 NOTE — Progress Notes (Signed)
History of Present Illness:      This very nice 48 y.o. MWM presents for 6 month follow up with HTN, HLD, Pre-Diabetes, Vit B12 Deficiency and Vitamin D Deficiency.       Patient is followed expectantly for labile  HTN (2010) & BP has been controlled at home. Today's BP is at goal - 116/82. Patient has had no complaints of any cardiac type chest pain, palpitations, dyspnea / orthopnea / PND, dizziness, claudication, or dependent edema.      Hyperlipidemia is not controlled with diet & meds. Patient denies myalgias or other med SE's. At last lab check , p[atient had been off of his meds & last Lipids were not at goal:  Lab Results  Component Value Date   CHOL 269 (H) 06/13/2018   HDL 41 06/13/2018   LDLCALC 176 (H) 06/13/2018   TRIG 305 (H) 06/13/2018   CHOLHDL 6.6 (H) 06/13/2018       Also, the patient is Overweight (BMI 31+) and is expectantly monitored for glucose intolerance and has had no symptoms of reactive hypoglycemia, diabetic polys, paresthesias or visual blurring.  Last A1c was Normal & at goal: Lab Results  Component Value Date   HGBA1C 5.0 06/13/2018       Further, the patient also has history of Vitamin D Deficiency ("27" / 2008 and "31" / 2018)  and supplements vitamin D without any suspected side-effects. Last vitamin D was still very low: Lab Results  Component Value Date   VD25OH 30 06/13/2018   Current Outpatient Medications on File Prior to Visit  Medication Sig  . Ascorbic Acid (VITA-C PO) Take by mouth.  Marland Kitchen aspirin 81 MG tablet Take 81 mg by mouth daily.  . Cholecalciferol (VITAMIN D) 2000 units CAPS Take 2 capsules by mouth daily.  . Cyanocobalamin (VITAMIN B-12 SL) Place 1 tablet under the tongue daily.  . Magnesium 200 MG TABS Take 1 tablet by mouth daily.  Marland Kitchen OVER THE COUNTER MEDICATION Takes Tri-B 1 daily.   No current facility-administered medications on file prior to visit.    No Known Allergies  PMHx:   Past Medical History:  Diagnosis Date  .  Hyperlipidemia   . Hypertension   . Hypogonadism male    Immunization History  Administered Date(s) Administered  . Influenza-Unspecified 05/04/2018  . PPD Test 04/05/2014, 05/03/2017, 06/13/2018  . Pneumococcal Polysaccharide-23 11/29/2008  . Td 05/03/2017  . Tdap 09/16/2006   History reviewed. No pertinent surgical history.  FHx:    Reviewed / unchanged  SHx:    Reviewed / unchanged   Systems Review:  Constitutional: Denies fever, chills, wt changes, headaches, insomnia, fatigue, night sweats, change in appetite. Eyes: Denies redness, blurred vision, diplopia, discharge, itchy, watery eyes.  ENT: Denies discharge, congestion, post nasal drip, epistaxis, sore throat, earache, hearing loss, dental pain, tinnitus, vertigo, sinus pain, snoring.  CV: Denies chest pain, palpitations, irregular heartbeat, syncope, dyspnea, diaphoresis, orthopnea, PND, claudication or edema. Respiratory: denies cough, dyspnea, DOE, pleurisy, hoarseness, laryngitis, wheezing.  Gastrointestinal: Denies dysphagia, odynophagia, heartburn, reflux, water brash, abdominal pain or cramps, nausea, vomiting, bloating, diarrhea, constipation, hematemesis, melena, hematochezia  or hemorrhoids. Genitourinary: Denies dysuria, frequency, urgency, nocturia, hesitancy, discharge, hematuria or flank pain. Musculoskeletal: Denies arthralgias, myalgias, stiffness, jt. swelling, pain, limping or strain/sprain.  Skin: Denies pruritus, rash, hives, warts, acne, eczema or change in skin lesion(s). Neuro: No weakness, tremor, incoordination, spasms, paresthesia or pain. Psychiatric: Denies confusion, memory loss or sensory loss. Endo: Denies change in weight,  skin or hair change.  Heme/Lymph: No excessive bleeding, bruising or enlarged lymph nodes.  Physical Exam  BP 116/82   Pulse 92   Temp (!) 97.1 F (36.2 C)   Resp 18   Ht 5' 6.5" (1.689 m)   Wt 205 lb 6.4 oz (93.2 kg)   BMI 32.66 kg/m   Appears  well nourished,  well groomed  and in no distress.  Eyes: PERRLA, EOMs, conjunctiva no swelling or erythema. Sinuses: No frontal/maxillary tenderness ENT/Mouth: EAC's clear, TM's nl w/o erythema, bulging. Nares clear w/o erythema, swelling, exudates. Oropharynx clear without erythema or exudates. Oral hygiene is good. Tongue normal, non obstructing. Hearing intact.  Neck: Supple. Thyroid not palpable. Car 2+/2+ without bruits, nodes or JVD. Chest: Respirations nl with BS clear & equal w/o rales, rhonchi, wheezing or stridor.  Cor: Heart sounds normal w/ regular rate and rhythm without sig. murmurs, gallops, clicks or rubs. Peripheral pulses normal and equal  without edema.  Abdomen: Soft & bowel sounds normal. Non-tender w/o guarding, rebound, hernias, masses or organomegaly.  Lymphatics: Unremarkable.  Musculoskeletal: Full ROM all peripheral extremities, joint stability, 5/5 strength and normal gait.  Skin: Warm, dry without exposed rashes, lesions or ecchymosis apparent.  Neuro: Cranial nerves intact, reflexes equal bilaterally. Sensory-motor testing grossly intact. Tendon reflexes grossly intact.  Pysch: Alert & oriented x 3.  Insight and judgement nl & appropriate. No ideations.  Assessment and Plan:  1. Labile hypertension  - Continue medication, monitor blood pressure at home.  - Continue DASH diet.  Reminder to go to the ER if any CP,  SOB, nausea, dizziness, severe HA, changes vision/speech.  - CBC with Differential/Platelet - COMPLETE METABOLIC PANEL WITH GFR - Magnesium - TSH  2. Hyperlipidemia, mixed  - Continue diet/meds, exercise,& lifestyle modifications.  - Continue monitor periodic cholesterol/liver & renal functions   - Lipid panel - TSH  3. Abnormal glucose  - Continue diet, exercise  - Lifestyle modifications.  - Monitor appropriate labs.  - Hemoglobin A1c - Insulin, random  4. Vitamin D deficiency  - Continue supplementation.  - VITAMIN D 25 Hydroxyl  5. Vitamin  B12 deficiency  - Vitamin B12 - Methylmalonic acid, serum  6. Medication management  - CBC with Differential/Platelet - COMPLETE METABOLIC PANEL WITH GFR - Magnesium - Lipid panel - TSH - Hemoglobin A1c - Insulin, random - VITAMIN D 25 Hydroxyl - Vitamin B12 - Methylmalonic acid, serum       Discussed  regular exercise, BP monitoring, weight control to achieve/maintain BMI less than 25 and discussed med and SE's. Recommended labs to assess and monitor clinical status with further disposition pending results of labs. I discussed the assessment and treatment plan with the patient. The patient was provided an opportunity to ask questions and all were answered. The patient agreed with the plan and demonstrated an understanding of the instructions. I provided over 30 minutes of exam, counseling, chart review and  complex critical decision making.      Jason MawWilliam D Hlee Fringer, MD

## 2018-12-26 NOTE — Patient Instructions (Signed)

## 2018-12-27 ENCOUNTER — Other Ambulatory Visit: Payer: Self-pay | Admitting: *Deleted

## 2018-12-27 ENCOUNTER — Other Ambulatory Visit: Payer: Self-pay

## 2018-12-27 ENCOUNTER — Ambulatory Visit (INDEPENDENT_AMBULATORY_CARE_PROVIDER_SITE_OTHER): Payer: BC Managed Care – PPO | Admitting: Internal Medicine

## 2018-12-27 ENCOUNTER — Other Ambulatory Visit: Payer: Self-pay | Admitting: Internal Medicine

## 2018-12-27 VITALS — BP 116/82 | HR 92 | Temp 97.1°F | Resp 18 | Ht 66.5 in | Wt 205.4 lb

## 2018-12-27 DIAGNOSIS — R0989 Other specified symptoms and signs involving the circulatory and respiratory systems: Secondary | ICD-10-CM | POA: Diagnosis not present

## 2018-12-27 DIAGNOSIS — R7309 Other abnormal glucose: Secondary | ICD-10-CM | POA: Diagnosis not present

## 2018-12-27 DIAGNOSIS — E559 Vitamin D deficiency, unspecified: Secondary | ICD-10-CM | POA: Diagnosis not present

## 2018-12-27 DIAGNOSIS — E782 Mixed hyperlipidemia: Secondary | ICD-10-CM | POA: Diagnosis not present

## 2018-12-27 DIAGNOSIS — Z79899 Other long term (current) drug therapy: Secondary | ICD-10-CM

## 2018-12-27 DIAGNOSIS — E538 Deficiency of other specified B group vitamins: Secondary | ICD-10-CM

## 2018-12-27 MED ORDER — ROSUVASTATIN CALCIUM 40 MG PO TABS: ORAL_TABLET | ORAL | 1 refills | Status: DC

## 2018-12-31 ENCOUNTER — Encounter: Payer: Self-pay | Admitting: Internal Medicine

## 2018-12-31 LAB — COMPLETE METABOLIC PANEL WITH GFR
AG Ratio: 1.8 (calc) (ref 1.0–2.5)
ALT: 33 U/L (ref 9–46)
AST: 22 U/L (ref 10–40)
Albumin: 4.9 g/dL (ref 3.6–5.1)
Alkaline phosphatase (APISO): 109 U/L (ref 36–130)
BUN: 15 mg/dL (ref 7–25)
CO2: 27 mmol/L (ref 20–32)
Calcium: 9.5 mg/dL (ref 8.6–10.3)
Chloride: 103 mmol/L (ref 98–110)
Creat: 0.88 mg/dL (ref 0.60–1.35)
GFR, Est African American: 118 mL/min/{1.73_m2} (ref >=60)
GFR, Est Non African American: 102 mL/min/{1.73_m2} (ref >=60)
Globulin: 2.7 g/dL (calc) (ref 1.9–3.7)
Glucose, Bld: 87 mg/dL (ref 65–99)
Potassium: 4.1 mmol/L (ref 3.5–5.3)
Sodium: 141 mmol/L (ref 135–146)
Total Bilirubin: 0.7 mg/dL (ref 0.2–1.2)
Total Protein: 7.6 g/dL (ref 6.1–8.1)

## 2018-12-31 LAB — CBC WITH DIFFERENTIAL/PLATELET
Absolute Monocytes: 447 cells/uL (ref 200–950)
Basophils Absolute: 29 cells/uL (ref 0–200)
Basophils Relative: 0.5 %
Eosinophils Absolute: 99 cells/uL (ref 15–500)
Eosinophils Relative: 1.7 %
HCT: 45.4 % (ref 38.5–50.0)
Hemoglobin: 16.2 g/dL (ref 13.2–17.1)
Lymphs Abs: 1543 cells/uL (ref 850–3900)
MCH: 33.3 pg — ABNORMAL HIGH (ref 27.0–33.0)
MCHC: 35.7 g/dL (ref 32.0–36.0)
MCV: 93.4 fL (ref 80.0–100.0)
MPV: 9.8 fL (ref 7.5–12.5)
Monocytes Relative: 7.7 %
Neutro Abs: 3683 cells/uL (ref 1500–7800)
Neutrophils Relative %: 63.5 %
Platelets: 244 10*3/uL (ref 140–400)
RBC: 4.86 10*6/uL (ref 4.20–5.80)
RDW: 13.6 % (ref 11.0–15.0)
Total Lymphocyte: 26.6 %
WBC: 5.8 10*3/uL (ref 3.8–10.8)

## 2018-12-31 LAB — INSULIN, RANDOM: Insulin: 5.1 u[IU]/mL

## 2018-12-31 LAB — METHYLMALONIC ACID, SERUM: Methylmalonic Acid, Quant: 136 nmol/L (ref 87–318)

## 2018-12-31 LAB — HEMOGLOBIN A1C
Hgb A1c MFr Bld: 4.9 % of total Hgb (ref <5.7)
Mean Plasma Glucose: 94 (calc)
eAG (mmol/L): 5.2 (calc)

## 2018-12-31 LAB — LIPID PANEL
Cholesterol: 240 mg/dL — ABNORMAL HIGH (ref <200)
HDL: 51 mg/dL (ref >=40)
LDL Cholesterol (Calc): 157 mg/dL (calc) — ABNORMAL HIGH
Non-HDL Cholesterol (Calc): 189 mg/dL (calc) — ABNORMAL HIGH (ref <130)
Total CHOL/HDL Ratio: 4.7 (calc) (ref <5.0)
Triglycerides: 183 mg/dL — ABNORMAL HIGH (ref <150)

## 2018-12-31 LAB — TSH: TSH: 3.34 mIU/L (ref 0.40–4.50)

## 2018-12-31 LAB — MAGNESIUM: Magnesium: 2 mg/dL (ref 1.5–2.5)

## 2018-12-31 LAB — VITAMIN B12: Vitamin B-12: 271 pg/mL (ref 200–1100)

## 2018-12-31 LAB — VITAMIN D 25 HYDROXY (VIT D DEFICIENCY, FRACTURES): Vit D, 25-Hydroxy: 39 ng/mL (ref 30–100)

## 2019-01-22 ENCOUNTER — Other Ambulatory Visit: Payer: Self-pay

## 2019-01-22 ENCOUNTER — Encounter (HOSPITAL_BASED_OUTPATIENT_CLINIC_OR_DEPARTMENT_OTHER): Payer: Self-pay | Admitting: Emergency Medicine

## 2019-01-22 ENCOUNTER — Emergency Department (HOSPITAL_BASED_OUTPATIENT_CLINIC_OR_DEPARTMENT_OTHER): Payer: BC Managed Care – PPO

## 2019-01-22 ENCOUNTER — Inpatient Hospital Stay (HOSPITAL_BASED_OUTPATIENT_CLINIC_OR_DEPARTMENT_OTHER)
Admission: EM | Admit: 2019-01-22 | Discharge: 2019-01-25 | DRG: 392 | Disposition: A | Discharge status: Home / Self Care | Payer: BC Managed Care – PPO | Attending: Internal Medicine | Admitting: Internal Medicine

## 2019-01-22 DIAGNOSIS — Z20828 Contact with and (suspected) exposure to other viral communicable diseases: Secondary | ICD-10-CM | POA: Diagnosis present

## 2019-01-22 DIAGNOSIS — E876 Hypokalemia: Secondary | ICD-10-CM | POA: Diagnosis present

## 2019-01-22 DIAGNOSIS — K5720 Diverticulitis of large intestine with perforation and abscess without bleeding: Secondary | ICD-10-CM | POA: Diagnosis not present

## 2019-01-22 DIAGNOSIS — E782 Mixed hyperlipidemia: Secondary | ICD-10-CM | POA: Diagnosis present

## 2019-01-22 DIAGNOSIS — F17220 Nicotine dependence, chewing tobacco, uncomplicated: Secondary | ICD-10-CM | POA: Diagnosis present

## 2019-01-22 DIAGNOSIS — K5792 Diverticulitis of intestine, part unspecified, without perforation or abscess without bleeding: Secondary | ICD-10-CM | POA: Diagnosis not present

## 2019-01-22 DIAGNOSIS — R319 Hematuria, unspecified: Secondary | ICD-10-CM | POA: Diagnosis present

## 2019-01-22 DIAGNOSIS — Z8349 Family history of other endocrine, nutritional and metabolic diseases: Secondary | ICD-10-CM

## 2019-01-22 DIAGNOSIS — Z79899 Other long term (current) drug therapy: Secondary | ICD-10-CM | POA: Diagnosis not present

## 2019-01-22 DIAGNOSIS — Z7982 Long term (current) use of aspirin: Secondary | ICD-10-CM | POA: Diagnosis not present

## 2019-01-22 DIAGNOSIS — K5732 Diverticulitis of large intestine without perforation or abscess without bleeding: Secondary | ICD-10-CM

## 2019-01-22 DIAGNOSIS — Z8249 Family history of ischemic heart disease and other diseases of the circulatory system: Secondary | ICD-10-CM | POA: Diagnosis not present

## 2019-01-22 DIAGNOSIS — I10 Essential (primary) hypertension: Secondary | ICD-10-CM | POA: Diagnosis present

## 2019-01-22 DIAGNOSIS — E291 Testicular hypofunction: Secondary | ICD-10-CM | POA: Diagnosis present

## 2019-01-22 HISTORY — DX: Diverticulitis of intestine, part unspecified, without perforation or abscess without bleeding: K57.92

## 2019-01-22 HISTORY — DX: Hematuria, unspecified: R31.9

## 2019-01-22 LAB — URINALYSIS, ROUTINE W REFLEX MICROSCOPIC
Glucose, UA: NEGATIVE mg/dL
Ketones, ur: 40 mg/dL — AB
Leukocytes,Ua: NEGATIVE
Nitrite: NEGATIVE
Protein, ur: 100 mg/dL — AB
Specific Gravity, Urine: >1.03 — ABNORMAL HIGH (ref 1.005–1.030)
pH: 6 (ref 5.0–8.0)

## 2019-01-22 LAB — CBC WITH DIFFERENTIAL/PLATELET
Abs Immature Granulocytes: 0.03 10*3/uL (ref 0.00–0.07)
Basophils Absolute: 0 10*3/uL (ref 0.0–0.1)
Basophils Relative: 0 %
Eosinophils Absolute: 0.1 10*3/uL (ref 0.0–0.5)
Eosinophils Relative: 1 %
HCT: 39 % (ref 39.0–52.0)
Hemoglobin: 13.2 g/dL (ref 13.0–17.0)
Immature Granulocytes: 0 %
Lymphocytes Relative: 10 %
Lymphs Abs: 1 10*3/uL (ref 0.7–4.0)
MCH: 33 pg (ref 26.0–34.0)
MCHC: 33.8 g/dL (ref 30.0–36.0)
MCV: 97.5 fL (ref 80.0–100.0)
Monocytes Absolute: 0.9 10*3/uL (ref 0.1–1.0)
Monocytes Relative: 8 %
Neutro Abs: 8.6 10*3/uL — ABNORMAL HIGH (ref 1.7–7.7)
Neutrophils Relative %: 81 %
Platelets: 168 10*3/uL (ref 150–400)
RBC: 4 MIL/uL — ABNORMAL LOW (ref 4.22–5.81)
RDW: 13.5 % (ref 11.5–15.5)
WBC: 10.5 10*3/uL (ref 4.0–10.5)
nRBC: 0 % (ref 0.0–0.2)

## 2019-01-22 LAB — COMPREHENSIVE METABOLIC PANEL
ALT: 27 U/L (ref 0–44)
AST: 18 U/L (ref 15–41)
Albumin: 4.2 g/dL (ref 3.5–5.0)
Alkaline Phosphatase: 91 U/L (ref 38–126)
Anion gap: 13 (ref 5–15)
BUN: 14 mg/dL (ref 6–20)
CO2: 24 mmol/L (ref 22–32)
Calcium: 8.9 mg/dL (ref 8.9–10.3)
Chloride: 103 mmol/L (ref 98–111)
Creatinine, Ser: 0.83 mg/dL (ref 0.61–1.24)
GFR calc Af Amer: >60 mL/min (ref >60)
GFR calc non Af Amer: >60 mL/min (ref >60)
Glucose, Bld: 123 mg/dL — ABNORMAL HIGH (ref 70–99)
Potassium: 3.3 mmol/L — ABNORMAL LOW (ref 3.5–5.1)
Sodium: 140 mmol/L (ref 135–145)
Total Bilirubin: 1.2 mg/dL (ref 0.3–1.2)
Total Protein: 7.9 g/dL (ref 6.5–8.1)

## 2019-01-22 LAB — SARS CORONAVIRUS 2 AG (30 MIN TAT): SARS Coronavirus 2 Ag: NEGATIVE

## 2019-01-22 LAB — URINALYSIS, MICROSCOPIC (REFLEX)

## 2019-01-22 LAB — SARS CORONAVIRUS 2 BY RT PCR (HOSPITAL ORDER, PERFORMED IN ~~LOC~~ HOSPITAL LAB): SARS Coronavirus 2: NEGATIVE

## 2019-01-22 LAB — LIPASE, BLOOD: Lipase: 20 U/L (ref 11–51)

## 2019-01-22 MED ORDER — SODIUM CHLORIDE 0.9 % IV SOLN
INTRAVENOUS | Status: DC
  Administered 2019-01-22 – 2019-01-25 (×6): via INTRAVENOUS

## 2019-01-22 MED ORDER — VITAMIN D 25 MCG (1000 UNIT) PO TABS
2000.0000 [IU] | ORAL_TABLET | Freq: Every day | ORAL | Status: DC
  Administered 2019-01-23 – 2019-01-25 (×3): 2000 [IU] via ORAL
  Filled 2019-01-22 (×3): qty 2

## 2019-01-22 MED ORDER — ONDANSETRON HCL 4 MG PO TABS: 4.0000 mg | ORAL_TABLET | Freq: Four times a day (QID) | ORAL | Status: DC | PRN

## 2019-01-22 MED ORDER — MAGNESIUM OXIDE 400 (241.3 MG) MG PO TABS
200.0000 mg | ORAL_TABLET | Freq: Every day | ORAL | Status: DC
  Administered 2019-01-22 – 2019-01-24 (×3): 200 mg via ORAL
  Filled 2019-01-22 (×3): qty 1

## 2019-01-22 MED ORDER — ACETAMINOPHEN 325 MG PO TABS
650.0000 mg | ORAL_TABLET | Freq: Four times a day (QID) | ORAL | Status: DC | PRN
  Filled 2019-01-22: qty 2

## 2019-01-22 MED ORDER — SODIUM CHLORIDE 0.9 % IV BOLUS
1000.0000 mL | Freq: Once | INTRAVENOUS | Status: AC
  Administered 2019-01-22: 1000 mL via INTRAVENOUS

## 2019-01-22 MED ORDER — MAGNESIUM 200 MG PO TABS
1.0000 | ORAL_TABLET | Freq: Every day | ORAL | Status: DC
  Filled 2019-01-22: qty 1

## 2019-01-22 MED ORDER — MORPHINE SULFATE (PF) 2 MG/ML IV SOLN
2.0000 mg | Freq: Four times a day (QID) | INTRAVENOUS | Status: DC | PRN
  Administered 2019-01-22: 2 mg via INTRAVENOUS
  Filled 2019-01-22: qty 1

## 2019-01-22 MED ORDER — VITAMIN B-12 1000 MCG PO TABS
1000.0000 ug | ORAL_TABLET | Freq: Every day | ORAL | Status: DC
  Administered 2019-01-22 – 2019-01-24 (×3): 1000 ug via ORAL
  Filled 2019-01-22 (×3): qty 1

## 2019-01-22 MED ORDER — TRAMADOL HCL 50 MG PO TABS: 50.0000 mg | ORAL_TABLET | Freq: Four times a day (QID) | ORAL | Status: DC | PRN

## 2019-01-22 MED ORDER — ZINC SULFATE 220 (50 ZN) MG PO CAPS
220.0000 mg | ORAL_CAPSULE | Freq: Every day | ORAL | Status: DC
  Administered 2019-01-22 – 2019-01-24 (×3): 220 mg via ORAL
  Filled 2019-01-22 (×3): qty 1

## 2019-01-22 MED ORDER — ACETAMINOPHEN 650 MG RE SUPP: 650.0000 mg | Freq: Four times a day (QID) | RECTAL | Status: DC | PRN

## 2019-01-22 MED ORDER — IOHEXOL 300 MG/ML  SOLN
100.0000 mL | Freq: Once | INTRAMUSCULAR | Status: AC | PRN
  Administered 2019-01-22: 100 mL via INTRAVENOUS

## 2019-01-22 MED ORDER — PIPERACILLIN-TAZOBACTAM 3.375 G IVPB
3.3750 g | Freq: Three times a day (TID) | INTRAVENOUS | Status: DC
  Administered 2019-01-22 – 2019-01-25 (×8): 3.375 g via INTRAVENOUS
  Filled 2019-01-22 (×8): qty 50

## 2019-01-22 MED ORDER — ROSUVASTATIN CALCIUM 20 MG PO TABS
40.0000 mg | ORAL_TABLET | Freq: Every day | ORAL | Status: DC
  Administered 2019-01-22 – 2019-01-24 (×3): 40 mg via ORAL
  Filled 2019-01-22 (×3): qty 2

## 2019-01-22 MED ORDER — PIPERACILLIN-TAZOBACTAM 3.375 G IVPB
3.3750 g | Freq: Once | INTRAVENOUS | Status: AC
  Administered 2019-01-22: 3.375 g via INTRAVENOUS
  Filled 2019-01-22: qty 50

## 2019-01-22 MED ORDER — ASPIRIN EC 81 MG PO TBEC
81.0000 mg | DELAYED_RELEASE_TABLET | Freq: Every day | ORAL | Status: DC
  Administered 2019-01-22 – 2019-01-24 (×3): 81 mg via ORAL
  Filled 2019-01-22 (×3): qty 1

## 2019-01-22 MED ORDER — ENOXAPARIN SODIUM 40 MG/0.4ML ~~LOC~~ SOLN
40.0000 mg | SUBCUTANEOUS | Status: DC
  Administered 2019-01-22 – 2019-01-24 (×3): 40 mg via SUBCUTANEOUS
  Filled 2019-01-22 (×3): qty 0.4

## 2019-01-22 MED ORDER — ONDANSETRON HCL 4 MG/2ML IJ SOLN: 4.0000 mg | Freq: Four times a day (QID) | INTRAMUSCULAR | Status: DC | PRN

## 2019-01-22 NOTE — ED Notes (Signed)
Attempted IV to LAC, tol well, unsuccessful

## 2019-01-22 NOTE — ED Notes (Signed)
Completed oral contrast. 

## 2019-01-22 NOTE — ED Notes (Addendum)
Drinking CT contrast, awaiting RN for U/S IV

## 2019-01-22 NOTE — ED Triage Notes (Signed)
Lower abd pain and diarrhea x 2 days.

## 2019-01-22 NOTE — ED Notes (Signed)
Report called to [place], receiving nurse at [facility]

## 2019-01-22 NOTE — H&P (Addendum)
.  History and Physical    Jason Curry DDU:[id number] DOB: [birth year] DOA: 01/22/2019  PCP: Lucky Cowboy, MD  Patient coming from: Home   Chief Complaint: Abdominal pain  HPI: Jason Curry is a 48 y.o. male with medical history significant of HLD. He presents with abdominal pain. He reports his symptoms began 2 days ago. He woke up with a general feeling of gas and bloating. It continued throughout the day. He noted at the time that he didn't have an appetite, but he did not have N/V. This sensation continued into yesterday and today; increasing in intensity and resulting in pain along the LLQ of the abdomen. He notes that he tried tums and "gas medicine"; but it did not help. Although his appetite has been poor, he did try to eat. Eating did not cause any additional pain or nausea. He reports diarrhea that has been persistent since Friday. Initially it was a watery color and consistency, but it has been darker today. He became concerned and came to the ED.   ED Course: Workup at [facility] including a CT abdomen, showed diverticulitis w/ microperfusion. Surgery was consulted and they will see him. He was sent for transfer to [facility], where is will be admitted by [facility]  Review of Systems: He denies CP, dyspnea, fevers, N, V, sick contacts. He reports chills. Remainder of 10 point review of systems is otherwise negative for all not mentioned in HPI.    Past Medical History:  Diagnosis Date  . Hematuria   . Hyperlipidemia   . Hypertension   . Hypogonadism male     History reviewed. No pertinent surgical history.   reports that he quit smoking about 25 years ago. His smokeless tobacco use includes chew. He reports current alcohol use of about 1.0 standard drinks of alcohol per week. He reports that he does not use drugs.  No Known Allergies  Family History  Problem Relation Age of Onset  . Cancer Mother        thyroid  . Heart disease Father   . Hypertension Father   .  Hyperlipidemia Father     Prior to Admission medications   Medication Sig Start Date End Date Taking? Authorizing Provider  Ascorbic Acid (VITA-C PO) Take 1 tablet by mouth daily.    Yes [provider]  aspirin 81 MG tablet Take 81 mg by mouth at bedtime.    Yes [provider]  Cholecalciferol (VITAMIN D) 2000 units CAPS Take 2 capsules by mouth daily.   Yes [provider]  Cyanocobalamin (VITAMIN B-12 SL) Place 1 tablet under the tongue at bedtime.    Yes [provider]  ibuprofen (ADVIL) 200 MG tablet Take 400 mg by mouth daily as needed for headache.   Yes [provider]  Magnesium 200 MG TABS Take 1 tablet by mouth at bedtime.    Yes [provider]  rosuvastatin (CRESTOR) 40 MG tablet Takes 1/2 to 1 tablet daily for cholesterol Patient taking differently: Take 40 mg by mouth at bedtime.  12/27/18  Yes Lucky Cowboy, MD  zinc gluconate 50 MG tablet Take 50 mg by mouth at bedtime.   Yes [provider]    Physical Exam: Vitals:   01/22/19 1030 01/22/19 1228 01/22/19 1400 01/22/19 1658  BP: (!) 133/96 (!) 143/93 126/84 (!) 136/91  Pulse: (!) 105 98 95 90  Resp: 16 18 16 16   Temp: 98.2 F (36.8 C) 99.1 F (37.3 C)  (!) 100.4  F (38 C)  TempSrc: Oral   Oral  SpO2: 100% 100% 100% 100%  Weight:    92.6 kg    Constitutional: 48 y.o. male NAD, calm, comfortable Vitals:   01/22/19 1030 01/22/19 1228 01/22/19 1400 01/22/19 1658  BP: (!) 133/96 (!) 143/93 126/84 (!) 136/91  Pulse: (!) 105 98 95 90  Resp: 16 18 16 16   Temp: 98.2 F (36.8 C) 99.1 F (37.3 C)  (!) 100.4 F (38 C)  TempSrc: Oral   Oral  SpO2: 100% 100% 100% 100%  Weight:    92.6 kg   Eyes: PERRL, lids and conjunctivae normal ENMT: Mucous membranes are moist. Posterior pharynx clear of any exudate or lesions.Normal dentition.  Neck: normal, supple, no masses, no thyromegaly Respiratory: clear to auscultation bilaterally, no wheezing, no  crackles. Normal respiratory effort. No accessory muscle use.  Cardiovascular: Regular rate and rhythm, no murmurs / rubs / gallops. No extremity edema. 2+ pedal pulses. No carotid bruits.  Abdomen: LLQ tenderness to palpation w/ some rebound, no masses palpated. No hepatosplenomegaly. Bowel sounds positive.  Musculoskeletal: no clubbing / cyanosis. No joint deformity upper and lower extremities. Good ROM, no contractures. Normal muscle tone.  Skin: no rashes, lesions, ulcers. No induration Neurologic: CN 2-12 grossly intact. Sensation intact, DTR normal. Strength 5/5 in all 4.  Psychiatric: Normal judgment and insight. Alert and oriented x 3. Normal mood.    Labs on Admission: I have personally reviewed following labs and imaging studies  CBC: Recent Labs  Lab 01/22/19 1100  WBC 10.5  NEUTROABS 8.6*  HGB 13.2  HCT 39.0  MCV 97.5  PLT 168   Basic Metabolic Panel: Recent Labs  Lab 01/22/19 1100  NA 140  K 3.3*  CL 103  CO2 24  GLUCOSE 123*  BUN 14  CREATININE 0.83  CALCIUM 8.9   GFR: Estimated Creatinine Clearance: 117 mL/min (by C-G formula based on SCr of 0.83 mg/dL). Liver Function Tests: Recent Labs  Lab 01/22/19 1100  AST 18  ALT 27  ALKPHOS 91  BILITOT 1.2  PROT 7.9  ALBUMIN 4.2   Recent Labs  Lab 01/22/19 1100  LIPASE 20   No results for input(s): AMMONIA in the last 168 hours. Coagulation Profile: No results for input(s): INR, PROTIME in the last 168 hours. Cardiac Enzymes: No results for input(s): CKTOTAL, CKMB, CKMBINDEX, TROPONINI in the last 168 hours. BNP (last 3 results) No results for input(s): PROBNP in the last 8760 hours. HbA1C: No results for input(s): HGBA1C in the last 72 hours. CBG: No results for input(s): GLUCAP in the last 168 hours. Lipid Profile: No results for input(s): CHOL, HDL, LDLCALC, TRIG, CHOLHDL, LDLDIRECT in the last 72 hours. Thyroid Function Tests: No results for input(s): TSH, T4TOTAL, FREET4, T3FREE, THYROIDAB  in the last 72 hours. Anemia Panel: No results for input(s): VITAMINB12, FOLATE, FERRITIN, TIBC, IRON, RETICCTPCT in the last 72 hours. Urine analysis:    Component Value Date/Time   COLORURINE AMBER (A) 01/22/2019 1047   APPEARANCEUR CLOUDY (A) 01/22/2019 1047   LABSPEC >1.030 (H) 01/22/2019 1047   PHURINE 6.0 01/22/2019 1047   GLUCOSEU NEGATIVE 01/22/2019 1047   HGBUR LARGE (A) 01/22/2019 1047   BILIRUBINUR SMALL (A) 01/22/2019 1047   KETONESUR 40 (A) 01/22/2019 1047   PROTEINUR 100 (A) 01/22/2019 1047   UROBILINOGEN 0.2 05/15/2014 1611   NITRITE NEGATIVE 01/22/2019 1047   LEUKOCYTESUR NEGATIVE 01/22/2019 1047    Radiological Exams on Admission: Ct Abdomen Pelvis W Contrast  Result  Date: 01/22/2019 CLINICAL DATA:  Abdominal pain and diarrhea EXAM: CT ABDOMEN AND PELVIS WITH CONTRAST TECHNIQUE: Multidetector CT imaging of the abdomen and pelvis was performed using the standard protocol following bolus administration of intravenous contrast. Oral contrast was also administered. CONTRAST:  OMNIPAQUE IOHEXOL 300 MG/ML  SOLN COMPARISON:  None. FINDINGS: Lower chest: Lung bases are clear. Hepatobiliary: No focal liver lesions are appreciable. The gallbladder wall is not appreciably thickened. There is no biliary duct dilatation. Pancreas: There is no pancreatic mass or inflammatory focus. Spleen: No splenic lesions are evident. Adrenals/Urinary Tract: Adrenals bilaterally appear normal. There is an 8 x 8 mm cyst in the upper pole of the right kidney. There is no appreciable hydronephrosis on either side. There is no evident renal or ureteral calculus on either side. Urinary bladder is midline. Urinary bladder wall is mildly thickened. Stomach/Bowel: There is extensive thickening of the wall of the mid sigmoid colon with soft tissue stranding and mild fluid consistent with diverticulitis. There is a site of microperforation in this area. No abscess seen. Elsewhere, there is no bowel wall or  mesenteric thickening. No bowel obstruction evident. Terminal ileum appears unremarkable. There is lipomatous infiltration of the ileocecal valve. Beyond the microperforation at the site of diverticulitis, there is no evident free air. No portal venous air. Vascular/Lymphatic: There is no abdominal aortic aneurysm. No vascular lesions are evident. There is no adenopathy in the abdomen or pelvis. Reproductive: Prostate and seminal vesicles are normal in size and configuration. No evident pelvic mass. Other: Appendix appears normal. No abscess or ascites evident in the abdomen or pelvis. There is a small ventral hernia containing only fat. Musculoskeletal: There is degenerative change at L5-S1. There are no blastic or lytic bone lesions. There is no intramuscular lesion. IMPRESSION: 1. Diverticulitis in the mid sigmoid colon. Focus of microperforation evident. No abscess. 2. No bowel obstruction. No abscess in the abdomen or pelvis. Appendix appears normal. 3. There is thickening of the urinary bladder wall, a finding felt to represent a degree of cystitis. 4.  No appreciable renal or ureteral calculus.  No hydronephrosis. 5.  Small ventral hernia containing only fat. Critical Value/emergent results were called by telephone at the time of interpretation on 01/22/2019 at 12:12 pm to Dr. Alvira Monday , who verbally acknowledged these results. Electronically Signed   By: Bretta Bang III M.D.   On: 01/22/2019 12:13    Assessment/Plan Principal Problem:   Acute diverticulitis Active Problems:   Hyperlipidemia, mixed   Acute diverticulitis     - admit to inpt med-tele     - surgery has been consulted per physician checkout, appreciate assistance     - microperforation noted on CT     - fluids, IV abx (zosyn), morphine     - currently NPO, can change to CLD, advance as tolerated  Hypokalemia     - replete; add Mg2+ to morning labs  Hx of HLD     - resume crestor  Chronic hematuria     - he  notes this is ongoing since he was a child; no etiology found, monitor   DVT prophylaxis: SCDs  Code Status: FULL  Family Communication: None at bedside.  Disposition Plan: TBD   Admission status: Inpatient; expect greater than stay for treatment.   Teddy Spike DO [facility] Pager [phone]  If 7PM-7AM, please contact night-coverage [email or web address] Password [facility]  01/22/2019, 5:42 PM

## 2019-01-22 NOTE — ED Notes (Signed)
MD removed in error when removing RN

## 2019-01-22 NOTE — ED Notes (Signed)
Patient transported to CT 

## 2019-01-22 NOTE — ED Provider Notes (Signed)
[place] EMERGENCY DEPARTMENT Provider Note   CSN: [id number] Arrival date & time: 01/22/19  1024    History   Chief Complaint Chief Complaint  Patient presents with  . Abdominal Pain    HPI Jason Curry is a 48 y.o. male.     HPI   47yo male with history of hypertension, hyperlipidemia, testosterone deficiency, who presents with concern for 2 days of abdominal pain and diarrhea.  Reports it is a cramping and pressure like pain across the lower abdomen and is currently 5/10 in severity. At times pain worsens. Diarrhea has been present for 2 days as well. No nausea or vomiting.  No known fevers. No urinary symptoms. No known history of diverticulitis but reports father has history.   Past Medical History:  Diagnosis Date  . Hematuria   . Hyperlipidemia   . Hypertension   . Hypogonadism male     Patient Active Problem List   Diagnosis Date Noted  . Acute diverticulitis 01/22/2019  . Labile hypertension 06/13/2018  . Abnormal glucose 06/13/2018  . Vitamin B12 deficiency 06/13/2018  . Testosterone deficiency 06/13/2018  . Former smoker 06/13/2018  . FHx: heart disease 06/13/2018  . Screening examination for pulmonary tuberculosis 06/13/2018  . Fatigue 06/13/2018  . Obesity (BMI 30.0-34.9) 08/09/2017  . Essential hypertension 06/13/2013  . Hyperlipidemia, mixed 06/13/2013  . Vitamin D deficiency 06/13/2013  . 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 Lumbago 06/13/2013  . Medication management 06/13/2013    History reviewed. No pertinent surgical history.      Home Medications    Prior to Admission medications   Medication Sig Start Date End Date Taking? Authorizing Provider  Ascorbic Acid (VITA-C PO) Take 1 tablet by mouth daily.    Yes [provider]  aspirin 81 MG tablet Take 81 mg by mouth at bedtime.    Yes [provider]  Cholecalciferol (VITAMIN D) 2000 units CAPS Take 2 capsules by mouth daily.   Yes [provider]  Cyanocobalamin (VITAMIN B-12 SL) Place 1 tablet under the tongue at bedtime.    Yes [provider]  ibuprofen (ADVIL) 200 MG tablet Take 400 mg by mouth daily as needed for headache.   Yes [provider]  Magnesium 200 MG TABS Take 1 tablet by mouth at bedtime.    Yes [provider]  rosuvastatin (CRESTOR) 40 MG tablet Takes 1/2 to 1 tablet daily for cholesterol Patient taking differently: Take 40 mg by mouth at bedtime.  12/27/18  Yes Unk Pinto, MD  zinc gluconate 50 MG tablet Take 50 mg by mouth at bedtime.   Yes [provider]    Family History Family History  Problem Relation Age of Onset  . Cancer Mother        thyroid  . Heart disease Father   . Hypertension Father   . Hyperlipidemia Father     Social History Social History   Tobacco Use  . Smoking status: Former Smoker    Quit date: 06/13/1993    Years since quitting: 25.6  . Smokeless tobacco: Current User    Types: Chew  Substance Use Topics  . Alcohol use: Yes    Alcohol/week: 1.0 standard drinks    Types: 1 Standard drinks or equivalent per week  . Drug use: No     Allergies   Patient has no known allergies.   Review of Systems Review of Systems  Constitutional: Negative for fever.  HENT: Negative for sore  throat.   Eyes: Negative for visual disturbance.  Respiratory: Negative for shortness of breath.   Cardiovascular: Negative for chest pain.  Gastrointestinal: Positive for abdominal pain and diarrhea. Negative for nausea and vomiting.  Genitourinary: Negative for difficulty urinating.  Musculoskeletal: Negative for back pain.  Skin: Negative for rash.  Neurological: Negative for syncope and headaches.     Physical Exam Updated Vital Signs BP (!) 140/96 (BP Location: Left Arm)   Pulse 100   Temp 100.1 F (37.8 C)   Resp 16   Ht 5\' 6"  (1.676 m)   Wt 92.6 kg   SpO2 99%   BMI 32.95 kg/m   Physical Exam Vitals signs and nursing  note reviewed.  Constitutional:      General: He is not in acute distress.    Appearance: He is well-developed. He is not diaphoretic.  HENT:     Head: Normocephalic and atraumatic.  Eyes:     Conjunctiva/sclera: Conjunctivae normal.  Neck:     Musculoskeletal: Normal range of motion.  Cardiovascular:     Rate and Rhythm: Normal rate and regular rhythm.     Heart sounds: Normal heart sounds. No murmur. No friction rub. No gallop.   Pulmonary:     Effort: Pulmonary effort is normal. No respiratory distress.     Breath sounds: Normal breath sounds. No wheezing or rales.  Abdominal:     General: There is no distension.     Palpations: Abdomen is soft.     Tenderness: There is abdominal tenderness in the right lower quadrant, suprapubic area and left lower quadrant. There is no guarding.  Skin:    General: Skin is warm and dry.  Neurological:     Mental Status: He is alert and oriented to person, place, and time.      ED Treatments / Results  Labs (all labs ordered are listed, but only abnormal results are displayed) Labs Reviewed  CBC WITH DIFFERENTIAL/PLATELET - Abnormal; Notable for the following components:      Result Value   RBC 4.00 (*)    Neutro Abs 8.6 (*)    All other components within normal limits  COMPREHENSIVE METABOLIC PANEL - Abnormal; Notable for the following components:   Potassium 3.3 (*)    Glucose, Bld 123 (*)    All other components within normal limits  URINALYSIS, ROUTINE W REFLEX MICROSCOPIC - Abnormal; Notable for the following components:   Color, Urine AMBER (*)    APPearance CLOUDY (*)    Specific Gravity, Urine >1.030 (*)    Hgb urine dipstick LARGE (*)    Bilirubin Urine SMALL (*)    Ketones, ur 40 (*)    Protein, ur 100 (*)    All other components within normal limits  URINALYSIS, MICROSCOPIC (REFLEX) - Abnormal; Notable for the following components:   Bacteria, UA MANY (*)    All other components within normal limits  SARS CORONAVIRUS  2 (HOSP ORDER, PERFORMED IN [place] [facility] VIA ABBOTT ID)  SARS CORONAVIRUS 2 (HOSPITAL ORDER, PERFORMED IN [place] [facility])  CULTURE, BLOOD (ROUTINE X 2)  CULTURE, BLOOD (ROUTINE X 2)  LIPASE, BLOOD  HIV ANTIBODY (ROUTINE TESTING W REFLEX)  CBC WITH DIFFERENTIAL/PLATELET  COMPREHENSIVE METABOLIC PANEL  MAGNESIUM    EKG None  Radiology Ct Abdomen Pelvis W Contrast  Result Date: 01/22/2019 CLINICAL DATA:  Abdominal pain and diarrhea EXAM: CT ABDOMEN AND PELVIS WITH CONTRAST TECHNIQUE: Multidetector CT imaging of the abdomen and pelvis was performed using  the standard protocol following bolus administration of intravenous contrast. Oral contrast was also administered. CONTRAST:  100mL OMNIPAQUE IOHEXOL 300 MG/ML  SOLN COMPARISON:  None. FINDINGS: Lower chest: Lung bases are clear. Hepatobiliary: No focal liver lesions are appreciable. The gallbladder wall is not appreciably thickened. There is no biliary duct dilatation. Pancreas: There is no pancreatic mass or inflammatory focus. Spleen: No splenic lesions are evident. Adrenals/Urinary Tract: Adrenals bilaterally appear normal. There is an 8 x 8 mm cyst in the upper pole of the right kidney. There is no appreciable hydronephrosis on either side. There is no evident renal or ureteral calculus on either side. Urinary bladder is midline. Urinary bladder wall is mildly thickened. Stomach/Bowel: There is extensive thickening of the wall of the mid sigmoid colon with soft tissue stranding and mild fluid consistent with diverticulitis. There is a site of microperforation in this area. No abscess seen. Elsewhere, there is no bowel wall or mesenteric thickening. No bowel obstruction evident. Terminal ileum appears unremarkable. There is lipomatous infiltration of the ileocecal valve. Beyond the microperforation at the site of diverticulitis, there is no evident free air. No portal venous air. Vascular/Lymphatic: There is no abdominal aortic  aneurysm. No vascular lesions are evident. There is no adenopathy in the abdomen or pelvis. Reproductive: Prostate and seminal vesicles are normal in size and configuration. No evident pelvic mass. Other: Appendix appears normal. No abscess or ascites evident in the abdomen or pelvis. There is a small ventral hernia containing only fat. Musculoskeletal: There is degenerative change at L5-S1. There are no blastic or lytic bone lesions. There is no intramuscular lesion. IMPRESSION: 1. Diverticulitis in the mid sigmoid colon. Focus of microperforation evident. No abscess. 2. No bowel obstruction. No abscess in the abdomen or pelvis. Appendix appears normal. 3. There is thickening of the urinary bladder wall, a finding felt to represent a degree of cystitis. 4.  No appreciable renal or ureteral calculus.  No hydronephrosis. 5.  Small ventral hernia containing only fat. Critical Value/emergent results were called by telephone at the time of interpretation on 01/22/2019 at 12:12 pm to Dr. Alvira MondayERIN Kiarra Kidd , who verbally acknowledged these results. Electronically Signed   By: Bretta BangWilliam  Woodruff III M.D.   On: 01/22/2019 12:13    Procedures .Critical Care Performed by: Alvira MondaySchlossman, Gurshan Settlemire, MD Authorized by: Alvira MondaySchlossman, Sherrine Salberg, MD   Critical care provider statement:    Critical care time (minutes):  30   Critical care was time spent personally by me on the following activities:  Discussions with consultants, ordering and review of laboratory studies, ordering and review of radiographic studies, pulse oximetry and examination of patient   (including critical care time)  Medications Ordered in ED Medications  0.9 %  sodium chloride infusion ( Intravenous Rate/Dose Verify 01/22/19 1800)  morphine 2 MG/ML injection 2 mg (2 mg Intravenous Given 01/22/19 1755)  enoxaparin (LOVENOX) injection 40 mg (40 mg Subcutaneous Given 01/22/19 1747)  acetaminophen (TYLENOL) tablet 650 mg (has no administration in time range)    Or   acetaminophen (TYLENOL) suppository 650 mg (has no administration in time range)  ondansetron (ZOFRAN) tablet 4 mg (has no administration in time range)    Or  ondansetron (ZOFRAN) injection 4 mg (has no administration in time range)  piperacillin-tazobactam (ZOSYN) IVPB 3.375 g (has no administration in time range)  traMADol (ULTRAM) tablet 50 mg (has no administration in time range)  aspirin EC tablet 81 mg (has no administration in time range)  rosuvastatin (CRESTOR) tablet 40 mg (  has no administration in time range)  vitamin B-12 (CYANOCOBALAMIN) tablet 1,000 mcg (has no administration in time range)  cholecalciferol (VITAMIN D3) tablet 2,000 Units (has no administration in time range)  Magnesium TABS 200 mg (has no administration in time range)  zinc sulfate capsule 220 mg (has no administration in time range)  sodium chloride 0.9 % bolus 1,000 mL (0 mLs Intravenous Stopped 01/22/19 1228)  iohexol (OMNIPAQUE) 300 MG/ML solution 100 mL (100 mLs Intravenous Contrast Given 01/22/19 1140)  piperacillin-tazobactam (ZOSYN) IVPB 3.375 g (0 g Intravenous Stopped 01/22/19 1320)     Initial Impression / Assessment and Plan / ED Course  I have reviewed the triage vital signs and the nursing notes.  Pertinent labs & imaging results that were available during my care of the patient were reviewed by me and considered in my medical decision making (see chart for details).        48yo male with history of hypertension, hyperlipidemia, testosterone deficiency, who presents with concern for 2 days of abdominal pain and diarrhea.  DDx includes diverticulitis, appendicitis, UTI.  CT abdomen pelvis shows diverticulitis with microperforation. Given zosyn. Consulted general surgery, Dr. Gerrit FriendsGerkin [facility] and consulted hospitalist for admission.   Final Clinical Impressions(s) / ED Diagnoses   Final diagnoses:  Diverticulitis of colon  Diverticulitis of large intestine with perforation  without bleeding    ED Discharge Orders    None       Alvira MondaySchlossman, Candia Kingsbury, MD 01/22/19 [id number]

## 2019-01-23 DIAGNOSIS — E876 Hypokalemia: Secondary | ICD-10-CM

## 2019-01-23 LAB — COMPREHENSIVE METABOLIC PANEL
ALT: 23 U/L (ref 0–44)
AST: 18 U/L (ref 15–41)
Albumin: 3.4 g/dL — ABNORMAL LOW (ref 3.5–5.0)
Alkaline Phosphatase: 72 U/L (ref 38–126)
Anion gap: 9 (ref 5–15)
BUN: 11 mg/dL (ref 6–20)
CO2: 25 mmol/L (ref 22–32)
Calcium: 8.3 mg/dL — ABNORMAL LOW (ref 8.9–10.3)
Chloride: 106 mmol/L (ref 98–111)
Creatinine, Ser: 0.88 mg/dL (ref 0.61–1.24)
GFR calc Af Amer: >60 mL/min (ref >60)
GFR calc non Af Amer: >60 mL/min (ref >60)
Glucose, Bld: 89 mg/dL (ref 70–99)
Potassium: 3.7 mmol/L (ref 3.5–5.1)
Sodium: 140 mmol/L (ref 135–145)
Total Bilirubin: 0.9 mg/dL (ref 0.3–1.2)
Total Protein: 6.5 g/dL (ref 6.5–8.1)

## 2019-01-23 LAB — CBC WITH DIFFERENTIAL/PLATELET
Abs Immature Granulocytes: 0.03 10*3/uL (ref 0.00–0.07)
Basophils Absolute: 0 10*3/uL (ref 0.0–0.1)
Basophils Relative: 1 %
Eosinophils Absolute: 0.1 10*3/uL (ref 0.0–0.5)
Eosinophils Relative: 2 %
HCT: 36 % — ABNORMAL LOW (ref 39.0–52.0)
Hemoglobin: 12 g/dL — ABNORMAL LOW (ref 13.0–17.0)
Immature Granulocytes: 0 %
Lymphocytes Relative: 20 %
Lymphs Abs: 1.6 10*3/uL (ref 0.7–4.0)
MCH: 33 pg (ref 26.0–34.0)
MCHC: 33.3 g/dL (ref 30.0–36.0)
MCV: 98.9 fL (ref 80.0–100.0)
Monocytes Absolute: 0.9 10*3/uL (ref 0.1–1.0)
Monocytes Relative: 10 %
Neutro Abs: 5.6 10*3/uL (ref 1.7–7.7)
Neutrophils Relative %: 67 %
Platelets: 170 10*3/uL (ref 150–400)
RBC: 3.64 MIL/uL — ABNORMAL LOW (ref 4.22–5.81)
RDW: 13.8 % (ref 11.5–15.5)
WBC: 8.2 10*3/uL (ref 4.0–10.5)
nRBC: 0 % (ref 0.0–0.2)

## 2019-01-23 LAB — HIV ANTIBODY (ROUTINE TESTING W REFLEX): HIV Screen 4th Generation wRfx: NONREACTIVE

## 2019-01-23 LAB — MAGNESIUM: Magnesium: 2.1 mg/dL (ref 1.7–2.4)

## 2019-01-23 NOTE — Progress Notes (Signed)
Mar[facility]  PROGRESS NOTE    Jason Curry  QMV:[id number] DOB: [birth year] DOA: 01/22/2019 PCP: Lucky Cowboy, MD   Brief Narrative:   Jason Curry is a 48 y.o. male with medical history significant of HLD. He presents with abdominal pain. He reports his symptoms began 2 days ago. He woke up with a general feeling of gas and bloating. It continued throughout the day. He noted at the time that he didn't have an appetite, but he did not have N/V. This sensation continued into yesterday and today; increasing in intensity and resulting in pain along the LLQ of the abdomen. He notes that he tried tums and "gas medicine"; but it did not help. Although his appetite has been poor, he did try to eat. Eating did not cause any additional pain or nausea. He reports diarrhea that has been persistent since Friday. Initially it was a watery color and consistency, but it has been darker today. He became concerned and came to the ED.    Assessment & Plan:   Principal Problem:   Acute diverticulitis Active Problems:   Hyperlipidemia, mixed   Acute diverticulitis     - admit to inpt med-tele     - surgery has been consulted per physician checkout, appreciate assistance     - microperforation noted on CT     - fluids, IV abx (zosyn), morphine     - CLD; will hold at this level per surgery  Hypokalemia     - replete; monitor     - Mg2+ ok  Hx of HLD     - resume crestor  Chronic hematuria     - he notes this is ongoing since he was a child; no etiology found, monitor  Tolerating CLD. Still TTP. Continuing abx per surgery. Will await their final rec.   DVT prophylaxis: lovenox Code Status: FULL   Disposition Plan: TBD   Consultants:   General Surgery   Antimicrobials:  . zosyn    Subjective: "I'm ok. I'm ok."  Objective: Vitals:   01/22/19 1658 01/22/19 1942 01/22/19 1952 01/23/19 0434  BP: (!) 136/91 (!) 140/96  110/78  Pulse: 90 100  71  Resp: 16 16  16   Temp: (!) 100.4 F  (38 C) (!) 100.7 F (38.2 C) 100.1 F (37.8 C) 98.1 F (36.7 C)  TempSrc: Oral Oral  Oral  SpO2: 100% 99%  99%  Weight: 92.6 kg     Height: 5\' 6"  (1.676 m)       Intake/Output Summary (Last 24 hours) at 01/23/2019 1252 Last data filed at 01/23/2019 0600 Gross per 24 hour  Intake 2967.58 ml  Output 350 ml  Net 2617.58 ml   Filed Weights   01/22/19 1658  Weight: 92.6 kg    Examination:  General: 48 y.o. male resting in bed in NAD Cardiovascular: RRR, +S1, S2, no m/g/r, equal pulses throughout Respiratory: CTABL, no w/r/r, normal WOB GI: BS+, ND,TTP LLQ, no masses noted, no organomegaly noted,  MSK: No e/c/c Skin: No rashes, bruises, ulcerations noted Neuro: A&O x 3, no focal deficits Psyc: Appropriate interaction and affect, calm/cooperative  Data Reviewed: I have personally reviewed following labs and imaging studies.  CBC: Recent Labs  Lab 01/22/19 1100 01/23/19 0406  WBC 10.5 8.2  NEUTROABS 8.6* 5.6  HGB 13.2 12.0*  HCT 39.0 36.0*  MCV 97.5 98.9  PLT 168 170   Basic Metabolic Panel: Recent Labs  Lab 01/22/19 1100 01/23/19 0406  NA 140 140  K 3.3* 3.7  CL 103 106  CO2 24 25  GLUCOSE 123* 89  BUN 14 11  CREATININE 0.83 0.88  CALCIUM 8.9 8.3*  MG  --  2.1   GFR: Estimated Creatinine Clearance: 109.3 mL/min (by C-G formula based on SCr of 0.88 mg/dL). Liver Function Tests: Recent Labs  Lab 01/22/19 1100 01/23/19 0406  AST 18 18  ALT 27 23  ALKPHOS 91 72  BILITOT 1.2 0.9  PROT 7.9 6.5  ALBUMIN 4.2 3.4*   Recent Labs  Lab 01/22/19 1100  LIPASE 20   No results for input(s): AMMONIA in the last 168 hours. Coagulation Profile: No results for input(s): INR, PROTIME in the last 168 hours. Cardiac Enzymes: No results for input(s): CKTOTAL, CKMB, CKMBINDEX, TROPONINI in the last 168 hours. BNP (last 3 results) No results for input(s): PROBNP in the last 8760 hours. HbA1C: No results for input(s): HGBA1C in the last 72 hours. CBG: No  results for input(s): GLUCAP in the last 168 hours. Lipid Profile: No results for input(s): CHOL, HDL, LDLCALC, TRIG, CHOLHDL, LDLDIRECT in the last 72 hours. Thyroid Function Tests: No results for input(s): TSH, T4TOTAL, FREET4, T3FREE, THYROIDAB in the last 72 hours. Anemia Panel: No results for input(s): VITAMINB12, FOLATE, FERRITIN, TIBC, IRON, RETICCTPCT in the last 72 hours. Sepsis Labs: No results for input(s): PROCALCITON, LATICACIDVEN in the last 168 hours.  Recent Results (from the past 240 hour(s))  SARS Coronavirus 2 (Hosp order,Performed in [facility] lab via [employer] ID)     Status: None   Collection Time: 01/22/19 12:30 PM   Specimen: Dry Nasal Swab ([employer] Now)  Result Value Ref Range Status   SARS Coronavirus 2 ([employer] Now) NEGATIVE NEGATIVE Final    Comment: (NOTE) SARS-CoV-2 target nucleic acids are NOT DETECTED. The SARS-CoV-2 RNA is generally detectable in upper and lower respiratory specimens during the acute phase of infection.  Negativeresults do not preclude SARS-CoV-2 infection, do not rule out coinfections with other pathogens, and should not be used as the  sole basis for treatment or other patient management decisions.  Negative results must be combined with clinical observations, patient history, and epidemiological information. The expected result is Negative. Fact Sheet for Patients: http://www.graves-ford.org/ Fact Sheet for Healthcare Providers: EnviroConcern.si This test is not yet approved or cleared by the [place] FDA and  has been authorized for detection and/or diagnosis of SARS-CoV-2 by FDA under an Emergency Use Authorization (EUA).  This EUA will remain in effect (meaning this test can be used) for the duration of  the COVID19 declaration under Section 5 64(b)(1) of the Act, 21 U.S.C.  section [phone] 3(b)(1), unless the authorization is terminated or revoked sooner. Performed at [facility], [address] [place], [place] [address]   SARS Coronavirus 2 (CEPHEID - Performed in [facility] hospital lab), Hosp Order     Status: None   Collection Time: 01/22/19  4:44 PM   Specimen: Nasopharyngeal Swab  Result Value Ref Range Status   SARS Coronavirus 2 NEGATIVE NEGATIVE Final    Comment: (NOTE) If result is NEGATIVE SARS-CoV-2 target nucleic acids are NOT DETECTED. The SARS-CoV-2 RNA is generally detectable in upper and lower  respiratory specimens during the acute phase of infection. The lowest  concentration of SARS-CoV-2 viral copies this assay can detect is 250  copies / mL. A negative result does not preclude SARS-CoV-2 infection  and should not be used as the sole basis for treatment or  other  patient management decisions.  A negative result may occur with  improper specimen collection / handling, submission of specimen other  than nasopharyngeal swab, presence of viral mutation(s) within the  areas targeted by this assay, and inadequate number of viral copies  (<250 copies / mL). A negative result must be combined with clinical  observations, patient history, and epidemiological information. If result is POSITIVE SARS-CoV-2 target nucleic acids are DETECTED. The SARS-CoV-2 RNA is generally detectable in upper and lower  respiratory specimens dur ing the acute phase of infection.  Positive  results are indicative of active infection with SARS-CoV-2.  Clinical  correlation with patient history and other diagnostic information is  necessary to determine patient infection status.  Positive results do  not rule out bacterial infection or co-infection with other viruses. If result is PRESUMPTIVE POSTIVE SARS-CoV-2 nucleic acids MAY BE PRESENT.   A presumptive positive result was obtained on the submitted specimen  and confirmed on repeat testing.  While 2019 novel coronavirus  (SARS-CoV-2) nucleic acids may be present in the submitted sample   additional confirmatory testing may be necessary for epidemiological  and / or clinical management purposes  to differentiate between  SARS-CoV-2 and other Sarbecovirus currently known to infect humans.  If clinically indicated additional testing with an alternate test  methodology [phone]) is advised. The SARS-CoV-2 RNA is generally  detectable in upper and lower respiratory sp ecimens during the acute  phase of infection. The expected result is Negative. Fact Sheet for Patients:  BoilerBrush.com.cy Fact Sheet for Healthcare Providers: https://pope.com/ This test is not yet approved or cleared by the [place] FDA and has been authorized for detection and/or diagnosis of SARS-CoV-2 by FDA under an Emergency Use Authorization (EUA).  This EUA will remain in effect (meaning this test can be used) for the duration of the COVID-19 declaration under Section 564(b)(1) of the Act, 21 U.S.C. section 360bbb-3(b)(1), unless the authorization is terminated or revoked sooner. Performed at [facility], [address], [place], [place] [address]          Radiology Studies: Ct Abdomen Pelvis W Contrast  Result Date: 01/22/2019 CLINICAL DATA:  Abdominal pain and diarrhea EXAM: CT ABDOMEN AND PELVIS WITH CONTRAST TECHNIQUE: Multidetector CT imaging of the abdomen and pelvis was performed using the standard protocol following bolus administration of intravenous contrast. Oral contrast was also administered. CONTRAST:  OMNIPAQUE IOHEXOL 300 MG/ML  SOLN COMPARISON:  None. FINDINGS: Lower chest: Lung bases are clear. Hepatobiliary: No focal liver lesions are appreciable. The gallbladder wall is not appreciably thickened. There is no biliary duct dilatation. Pancreas: There is no pancreatic mass or inflammatory focus. Spleen: No splenic lesions are evident. Adrenals/Urinary Tract: Adrenals bilaterally appear normal. There is an  8 x 8 mm cyst in the upper pole of the right kidney. There is no appreciable hydronephrosis on either side. There is no evident renal or ureteral calculus on either side. Urinary bladder is midline. Urinary bladder wall is mildly thickened. Stomach/Bowel: There is extensive thickening of the wall of the mid sigmoid colon with soft tissue stranding and mild fluid consistent with diverticulitis. There is a site of microperforation in this area. No abscess seen. Elsewhere, there is no bowel wall or mesenteric thickening. No bowel obstruction evident. Terminal ileum appears unremarkable. There is lipomatous infiltration of the ileocecal valve. Beyond the microperforation at the site of diverticulitis, there is no evident free air. No portal venous air. Vascular/Lymphatic: There is no abdominal aortic  aneurysm. No vascular lesions are evident. There is no adenopathy in the abdomen or pelvis. Reproductive: Prostate and seminal vesicles are normal in size and configuration. No evident pelvic mass. Other: Appendix appears normal. No abscess or ascites evident in the abdomen or pelvis. There is a small ventral hernia containing only fat. Musculoskeletal: There is degenerative change at L5-S1. There are no blastic or lytic bone lesions. There is no intramuscular lesion. IMPRESSION: 1. Diverticulitis in the mid sigmoid colon. Focus of microperforation evident. No abscess. 2. No bowel obstruction. No abscess in the abdomen or pelvis. Appendix appears normal. 3. There is thickening of the urinary bladder wall, a finding felt to represent a degree of cystitis. 4.  No appreciable renal or ureteral calculus.  No hydronephrosis. 5.  Small ventral hernia containing only fat. Critical Value/emergent results were called by telephone at the time of interpretation on 01/22/2019 at 12:12 pm to Dr. Alvira Monday , who verbally acknowledged these results. Electronically Signed   By: Bretta Bang III M.D.   On: 01/22/2019 12:13     Scheduled Meds: . aspirin EC  81 mg Oral QHS  . cholecalciferol  2,000 Units Oral Daily  . enoxaparin (LOVENOX) injection  40 mg Subcutaneous Q24H  . magnesium oxide  200 mg Oral QHS  . rosuvastatin  40 mg Oral QHS  . vitamin B-12  1,000 mcg Oral QHS  . zinc sulfate  220 mg Oral QHS   Continuous Infusions: . sodium chloride 100 mL/hr at 01/23/19 0335  . piperacillin-tazobactam (ZOSYN)  IV 3.375 g (01/23/19 1220)     LOS: 1 day    Time spent: 25 minutes spent in the coordination of care today.    Teddy Spike, DO [facility] Pager [phone]  If 7PM-7AM, please contact night-coverage [email or web address] Password [email or web address] 01/23/2019, 12:52 PM

## 2019-01-23 NOTE — Consult Note (Addendum)
[facility] Consult Note  [name] [birth year]  [id number].    Requesting MD: Margie Ege Chief Complaint/Reason for Consult: diverticulitis  HPI:  [facility] is a 48yo male PMH HLD and chronic hematuria, who was admitted to [facility]12 with acute sigmoid diverticulitis. No prior h/o diverticulitis and he has never had a colonoscopy.   States that he started having abdominal pain on 7/11. Initially he thought he was having gas pains. Pain was suprapubic and gradually got worse. Associated with multiple episodes of diarrhea, fever up to 100.7, chills, and anorexia. Denies nausea, vomiting, constipation. Initially his stools were watery, and then they became dark. No blood in stool.   ED workup included CT scan which showed diverticulitis in the mid sigmoid colon with focus of microperforation, no abscess. WBC 10.5 Patient was admitted to medicine and started on IV zosyn.  General surgery asked to see.  Abdominal surgical history: none Anticoagulants: none Nonsmoker Employment: maintenance at [employer]  ROS: Review of Systems  Constitutional: Positive for chills and fever.  HENT: Negative.   Eyes: Negative.   Respiratory: Negative.   Cardiovascular: Negative.   Gastrointestinal: Positive for abdominal pain and diarrhea. Negative for blood in stool, constipation, nausea and vomiting.  Genitourinary: Positive for hematuria.  Musculoskeletal: Positive for back pain.  Skin: Negative.   Neurological: Negative.    All systems reviewed and otherwise negative except for as above  Family History  Problem Relation Age of Onset  . Cancer Mother        thyroid  . Heart disease Father   . Hypertension Father   . Hyperlipidemia Father     Past Medical History:  Diagnosis Date  . Hematuria   . Hyperlipidemia   . Hypertension   . Hypogonadism male     History reviewed. No pertinent surgical history.  Social History:  reports that he quit smoking about 25 years  ago. His smokeless tobacco use includes chew. He reports current alcohol use of about 1.0 standard drinks of alcohol per week. He reports that he does not use drugs.  Allergies: No Known Allergies  Medications Prior to Admission  Medication Sig Dispense Refill  . Ascorbic Acid (VITA-C PO) Take 1 tablet by mouth daily.     Marland Kitchen aspirin 81 MG tablet Take 81 mg by mouth at bedtime.     . Cholecalciferol (VITAMIN D) 2000 units CAPS Take 2 capsules by mouth daily.    . Cyanocobalamin (VITAMIN B-12 SL) Place 1 tablet under the tongue at bedtime.     Marland Kitchen ibuprofen (ADVIL) 200 MG tablet Take 400 mg by mouth daily as needed for headache.    . Magnesium 200 MG TABS Take 1 tablet by mouth at bedtime.     . rosuvastatin (CRESTOR) 40 MG tablet Takes 1/2 to 1 tablet daily for cholesterol (Patient taking differently: Take 40 mg by mouth at bedtime. ) 90 tablet 1  . zinc gluconate 50 MG tablet Take 50 mg by mouth at bedtime.      Prior to Admission medications   Medication Sig Start Date End Date Taking? Authorizing Provider  Ascorbic Acid (VITA-C PO) Take 1 tablet by mouth daily.    Yes [provider]  aspirin 81 MG tablet Take 81 mg by mouth at bedtime.    Yes [provider]  Cholecalciferol (VITAMIN D) 2000 units CAPS Take 2 capsules by mouth daily.   Yes [provider]  Cyanocobalamin (VITAMIN B-12 SL) Place 1 tablet under  the tongue at bedtime.    Yes [provider]  ibuprofen (ADVIL) 200 MG tablet Take 400 mg by mouth daily as needed for headache.   Yes [provider]  Magnesium 200 MG TABS Take 1 tablet by mouth at bedtime.    Yes [provider]  rosuvastatin (CRESTOR) 40 MG tablet Takes 1/2 to 1 tablet daily for cholesterol Patient taking differently: Take 40 mg by mouth at bedtime.  12/27/18  Yes Lucky Cowboy, MD  zinc gluconate 50 MG tablet Take 50 mg by mouth at bedtime.   Yes [provider]    Blood pressure 110/78, pulse  71, temperature 98.1 F (36.7 C), temperature source Oral, resp. rate 16, height 5\' 6"  (1.676 m), weight 92.6 kg, SpO2 99 %. Physical Exam: General: pleasant, WD/WN white male who is laying in bed in NAD HEENT: head is normocephalic, atraumatic.  Sclera are noninjected.  Pupils equal and round.  Ears and nose without any masses or lesions.  Mouth is pink and moist. Dentition fair Heart: regular, rate, and rhythm.  No obvious murmurs, gallops, or rubs noted.  Palpable pedal pulses bilaterally Lungs: CTAB, no wheezes, rhonchi, or rales noted.  Respiratory effort nonlabored Abd: soft, ND, +BS, no masses or organomegaly. Mild suprapubic TTP without rebound or guarding MS: all 4 extremities are symmetrical with no cyanosis, clubbing, or edema. Skin: warm and dry with no masses, lesions, or rashes Psych: A&Ox3 with an appropriate affect. Neuro: cranial nerves grossly intact, extremity CSM intact bilaterally, normal speech  Results for orders placed or performed during the [facility] encounter of 01/22/19 (from the past 48 hour(s))  Urinalysis, Routine w reflex microscopic     Status: Abnormal   Collection Time: 01/22/19 10:47 AM  Result Value Ref Range   Color, Urine AMBER (A) YELLOW    Comment: BIOCHEMICALS MAY BE AFFECTED BY COLOR   APPearance CLOUDY (A) CLEAR   Specific Gravity, Urine >1.030 (H) 1.005 - 1.030   pH 6.0 5.0 - 8.0   Glucose, UA NEGATIVE NEGATIVE mg/dL   Hgb urine dipstick LARGE (A) NEGATIVE   Bilirubin Urine SMALL (A) NEGATIVE   Ketones, ur 40 (A) NEGATIVE mg/dL   Protein, ur 161 (A) NEGATIVE mg/dL   Nitrite NEGATIVE NEGATIVE   Leukocytes,Ua NEGATIVE NEGATIVE    Comment: Performed at [facility], [address] [place], [place] [address]  Urinalysis, Microscopic (reflex)     Status: Abnormal   Collection Time: 01/22/19 10:47 AM  Result Value Ref Range   RBC / HPF 6-10 0 - 5 RBC/hpf   WBC, UA 0-5 0 - 5 WBC/hpf   Bacteria, UA MANY (A) NONE SEEN   Squamous  Epithelial / LPF 0-5 0 - 5   Mucus PRESENT    Amorphous Crystal PRESENT     Comment: Performed at [facility], [address] [place], [place] [address]  CBC with Differential     Status: Abnormal   Collection Time: 01/22/19 11:00 AM  Result Value Ref Range   WBC 10.5 4.0 - 10.5 K/uL   RBC 4.00 (L) 4.22 - 5.81 MIL/uL   Hemoglobin 13.2 13.0 - 17.0 g/dL   HCT 11.9 14.7 - 82.9 %   MCV 97.5 80.0 - 100.0 fL   MCH 33.0 26.0 - 34.0 pg   MCHC 33.8 30.0 - 36.0 g/dL   RDW 56.2 13.0 - 86.5 %   Platelets 168 150 - 400 K/uL   nRBC 0.0 0.0 -  0.2 %   Neutrophils Relative % 81 %   Neutro Abs 8.6 (H) 1.7 - 7.7 K/uL   Lymphocytes Relative 10 %   Lymphs Abs 1.0 0.7 - 4.0 K/uL   Monocytes Relative 8 %   Monocytes Absolute 0.9 0.1 - 1.0 K/uL   Eosinophils Relative 1 %   Eosinophils Absolute 0.1 0.0 - 0.5 K/uL   Basophils Relative 0 %   Basophils Absolute 0.0 0.0 - 0.1 K/uL   Immature Granulocytes 0 %   Abs Immature Granulocytes 0.03 0.00 - 0.07 K/uL    Comment: Performed at [facility], [address] [facility] [address] [place], [place] [address]  Comprehensive metabolic panel     Status: Abnormal   Collection Time: 01/22/19 11:00 AM  Result Value Ref Range   Sodium 140 135 - 145 mmol/L   Potassium 3.3 (L) 3.5 - 5.1 mmol/L   Chloride 103 98 - 111 mmol/L   CO2 24 22 - 32 mmol/L   Glucose, Bld 123 (H) 70 - 99 mg/dL   BUN 14 6 - 20 mg/dL   Creatinine, Ser 6.96 0.61 - 1.24 mg/dL   Calcium 8.9 8.9 - 29.5 mg/dL   Total Protein 7.9 6.5 - 8.1 g/dL   Albumin 4.2 3.5 - 5.0 g/dL   AST 18 15 - 41 U/L   ALT 27 0 - 44 U/L   Alkaline Phosphatase 91 38 - 126 U/L   Total Bilirubin 1.2 0.3 - 1.2 mg/dL   GFR calc non Af Amer >60 >60 mL/min   GFR calc Af Amer >60 >60 mL/min   Anion gap 13 5 - 15    Comment: Performed at [facility], [address] [place], [place] [address]  Lipase, blood     Status: None   Collection Time: 01/22/19 11:00 AM  Result Value Ref Range   Lipase 20 11 -  51 U/L    Comment: Performed at [facility], [address] [place], [place] [address]  SARS Coronavirus 2 (Hosp order,Performed in [facility] lab via [employer] ID)     Status: None   Collection Time: 01/22/19 12:30 PM   Specimen: Dry Nasal Swab ([employer]ott ID Now)  Result Value Ref Range   SARS Coronavirus 2 (Abbott ID Now) NEGATIVE NEGATIVE    Comment: (NOTE) SARS-CoV-2 target nucleic acids are NOT DETECTED. The SARS-CoV-2 RNA is generally detectable in upper and lower respiratory specimens during the acute phase of infection.  Negativeresults do not preclude SARS-CoV-2 infection, do not rule out coinfections with other pathogens, and should not be used as the  sole basis for treatment or other patient management decisions.  Negative results must be combined with clinical observations, patient history, and epidemiological information. The expected result is Negative. Fact Sheet for Patients: http://www.graves-ford.org/ Fact Sheet for Healthcare Providers: EnviroConcern.si This test is not yet approved or cleared by the [place] FDA and  has been authorized for detection and/or diagnosis of SARS-CoV-2 by FDA under an Emergency Use Authorization (EUA).  This EUA will remain in effect (meaning this test can be used) for the duration of  the COVID19 declaration under Section 5 64(b)(1) of the Act, 21 U.S.C.  section [phone] 3(b)(1), unless the authorization is terminated or revoked sooner. Performed at [facility], [address] [place], [place] [address]   SARS Coronavirus 2 (CEPHEID - Performed in [facility] lab), [facility] Order     Status: None  Collection Time: 01/22/19  4:44 PM   Specimen: Nasopharyngeal Swab  Result Value Ref Range   SARS Coronavirus 2 NEGATIVE NEGATIVE    Comment: (NOTE) If result is NEGATIVE SARS-CoV-2 target nucleic acids are NOT DETECTED. The SARS-CoV-2 RNA is generally detectable  in upper and lower  respiratory specimens during the acute phase of infection. The lowest  concentration of SARS-CoV-2 viral copies this assay can detect is 250  copies / mL. A negative result does not preclude SARS-CoV-2 infection  and should not be used as the sole basis for treatment or other  patient management decisions.  A negative result may occur with  improper specimen collection / handling, submission of specimen other  than nasopharyngeal swab, presence of viral mutation(s) within the  areas targeted by this assay, and inadequate number of viral copies  (<250 copies / mL). A negative result must be combined with clinical  observations, patient history, and epidemiological information. If result is POSITIVE SARS-CoV-2 target nucleic acids are DETECTED. The SARS-CoV-2 RNA is generally detectable in upper and lower  respiratory specimens dur ing the acute phase of infection.  Positive  results are indicative of active infection with SARS-CoV-2.  Clinical  correlation with patient history and other diagnostic information is  necessary to determine patient infection status.  Positive results do  not rule out bacterial infection or co-infection with other viruses. If result is PRESUMPTIVE POSTIVE SARS-CoV-2 nucleic acids MAY BE PRESENT.   A presumptive positive result was obtained on the submitted specimen  and confirmed on repeat testing.  While 2019 novel coronavirus  (SARS-CoV-2) nucleic acids may be present in the submitted sample  additional confirmatory testing may be necessary for epidemiological  and / or clinical management purposes  to differentiate between  SARS-CoV-2 and other Sarbecovirus currently known to infect humans.  If clinically indicated additional testing with an alternate test  methodology [phone]) is advised. The SARS-CoV-2 RNA is generally  detectable in upper and lower respiratory sp ecimens during the acute  phase of infection. The expected result is  Negative. Fact Sheet for Patients:  BoilerBrush.com.cy Fact Sheet for Healthcare Providers: https://pope.com/ This test is not yet approved or cleared by the [place] FDA and has been authorized for detection and/or diagnosis of SARS-CoV-2 by FDA under an Emergency Use Authorization (EUA).  This EUA will remain in effect (meaning this test can be used) for the duration of the COVID-19 declaration under Section 564(b)(1) of the Act, 21 U.S.C. section 360bbb-3(b)(1), unless the authorization is terminated or revoked sooner. Performed at [facility], [address] [place], [place] [address]   CBC with Differential/Platelet     Status: Abnormal   Collection Time: 01/23/19  4:06 AM  Result Value Ref Range   WBC 8.2 4.0 - 10.5 K/uL   RBC 3.64 (L) 4.22 - 5.81 MIL/uL   Hemoglobin 12.0 (L) 13.0 - 17.0 g/dL   HCT 29.5 (L) 62.1 - 30.8 %   MCV 98.9 80.0 - 100.0 fL   MCH 33.0 26.0 - 34.0 pg   MCHC 33.3 30.0 - 36.0 g/dL   RDW 65.7 84.6 - 96.2 %   Platelets 170 150 - 400 K/uL   nRBC 0.0 0.0 - 0.2 %   Neutrophils Relative % 67 %   Neutro Abs 5.6 1.7 - 7.7 K/uL   Lymphocytes Relative 20 %   Lymphs Abs 1.6 0.7 - 4.0 K/uL   Monocytes Relative 10 %   Monocytes Absolute 0.9 0.1 - 1.0  K/uL   Eosinophils Relative 2 %   Eosinophils Absolute 0.1 0.0 - 0.5 K/uL   Basophils Relative 1 %   Basophils Absolute 0.0 0.0 - 0.1 K/uL   Immature Granulocytes 0 %   Abs Immature Granulocytes 0.03 0.00 - 0.07 K/uL    Comment: Performed at [facility], [address], [place], [place] [address]  Comprehensive metabolic panel     Status: Abnormal   Collection Time: 01/23/19  4:06 AM  Result Value Ref Range   Sodium 140 135 - 145 mmol/L   Potassium 3.7 3.5 - 5.1 mmol/L   Chloride 106 98 - 111 mmol/L   CO2 25 22 - 32 mmol/L   Glucose, Bld 89 70 - 99 mg/dL   BUN 11 6 - 20 mg/dL   Creatinine, Ser 7.25 0.61 - 1.24 mg/dL    Calcium 8.3 (L) 8.9 - 10.3 mg/dL   Total Protein 6.5 6.5 - 8.1 g/dL   Albumin 3.4 (L) 3.5 - 5.0 g/dL   AST 18 15 - 41 U/L   ALT 23 0 - 44 U/L   Alkaline Phosphatase 72 38 - 126 U/L   Total Bilirubin 0.9 0.3 - 1.2 mg/dL   GFR calc non Af Amer >60 >60 mL/min   GFR calc Af Amer >60 >60 mL/min   Anion gap 9 5 - 15    Comment: Performed at [facility], [address], [place], [place] [address]  Magnesium     Status: None   Collection Time: 01/23/19  4:06 AM  Result Value Ref Range   Magnesium 2.1 1.7 - 2.4 mg/dL    Comment: Performed at [facility], [address], [place], [place] [address]   Ct Abdomen Pelvis W Contrast  Result Date: 01/22/2019 CLINICAL DATA:  Abdominal pain and diarrhea EXAM: CT ABDOMEN AND PELVIS WITH CONTRAST TECHNIQUE: Multidetector CT imaging of the abdomen and pelvis was performed using the standard protocol following bolus administration of intravenous contrast. Oral contrast was also administered. CONTRAST:  OMNIPAQUE IOHEXOL 300 MG/ML  SOLN COMPARISON:  None. FINDINGS: Lower chest: Lung bases are clear. Hepatobiliary: No focal liver lesions are appreciable. The gallbladder wall is not appreciably thickened. There is no biliary duct dilatation. Pancreas: There is no pancreatic mass or inflammatory focus. Spleen: No splenic lesions are evident. Adrenals/Urinary Tract: Adrenals bilaterally appear normal. There is an 8 x 8 mm cyst in the upper pole of the right kidney. There is no appreciable hydronephrosis on either side. There is no evident renal or ureteral calculus on either side. Urinary bladder is midline. Urinary bladder wall is mildly thickened. Stomach/Bowel: There is extensive thickening of the wall of the mid sigmoid colon with soft tissue stranding and mild fluid consistent with diverticulitis. There is a site of microperforation in this area. No abscess seen. Elsewhere, there is no bowel wall or mesenteric  thickening. No bowel obstruction evident. Terminal ileum appears unremarkable. There is lipomatous infiltration of the ileocecal valve. Beyond the microperforation at the site of diverticulitis, there is no evident free air. No portal venous air. Vascular/Lymphatic: There is no abdominal aortic aneurysm. No vascular lesions are evident. There is no adenopathy in the abdomen or pelvis. Reproductive: Prostate and seminal vesicles are normal in size and configuration. No evident pelvic mass. Other: Appendix appears normal. No abscess or ascites evident in the abdomen or pelvis. There is a small ventral hernia containing only fat. Musculoskeletal: There is degenerative change at L5-S1. There are  no blastic or lytic bone lesions. There is no intramuscular lesion. IMPRESSION: 1. Diverticulitis in the mid sigmoid colon. Focus of microperforation evident. No abscess. 2. No bowel obstruction. No abscess in the abdomen or pelvis. Appendix appears normal. 3. There is thickening of the urinary bladder wall, a finding felt to represent a degree of cystitis. 4.  No appreciable renal or ureteral calculus.  No hydronephrosis. 5.  Small ventral hernia containing only fat. Critical Value/emergent results were called by telephone at the time of interpretation on 01/22/2019 at 12:12 pm to Dr. Alvira Monday , who verbally acknowledged these results. Electronically Signed   By: Bretta Bang III M.D.   On: 01/22/2019 12:13   Anti-infectives (From admission, onward)   Start     Dose/Rate Route Frequency Ordered Stop   01/22/19 2000  piperacillin-tazobactam (ZOSYN) IVPB 3.375 g     3.375 g 12.5 mL/hr over 240 Minutes Intravenous Every 8 hours 01/22/19 1655     01/22/19 1300  piperacillin-tazobactam (ZOSYN) IVPB 3.375 g     3.375 g 12.5 mL/hr over 240 Minutes Intravenous  Once 01/22/19 1224 01/22/19 1320        Assessment/Plan HLD  Chronic hematuria  Sigmoid diverticulitis with microperforation  - 1st bout of  diverticulitis  - never had a colonoscopy before  - WBC normalized and patient only minimally tender on exam. Ok for clear liquids but would not advance past this today. Continue IV zosyn. If pain continues to improve tomorrow with gradually advance diet.   Discussed with the patient that if this resolves medically we will plan for a colonoscopy in 6-8 weeks.  Will continue to follow.  ID - zosyn 7/12>> VTE - SCDs, lovenox FEN - IVF, CLD Foley - none Follow up - TBD  Franne Forts, [facility] 01/23/2019, 11:01 AM Pager: [phone]  Agree with above. Feels better.  Married.  Wife - Marylene Land.  He works at [employer] at [employer].  Ovidio Kin, MD, [facility] Pager: [phone] Office phone:  [phone]

## 2019-01-24 LAB — RENAL FUNCTION PANEL
Albumin: 4 g/dL (ref 3.5–5.0)
Anion gap: 11 (ref 5–15)
BUN: 8 mg/dL (ref 6–20)
CO2: 24 mmol/L (ref 22–32)
Calcium: 9 mg/dL (ref 8.9–10.3)
Chloride: 106 mmol/L (ref 98–111)
Creatinine, Ser: 0.89 mg/dL (ref 0.61–1.24)
GFR calc Af Amer: >60 mL/min (ref >60)
GFR calc non Af Amer: >60 mL/min (ref >60)
Glucose, Bld: 81 mg/dL (ref 70–99)
Phosphorus: 3.5 mg/dL (ref 2.5–4.6)
Potassium: 4.5 mmol/L (ref 3.5–5.1)
Sodium: 141 mmol/L (ref 135–145)

## 2019-01-24 LAB — CBC WITH DIFFERENTIAL/PLATELET
Abs Immature Granulocytes: 0.01 10*3/uL (ref 0.00–0.07)
Basophils Absolute: 0 10*3/uL (ref 0.0–0.1)
Basophils Relative: 1 %
Eosinophils Absolute: 0.2 10*3/uL (ref 0.0–0.5)
Eosinophils Relative: 4 %
HCT: 45.1 % (ref 39.0–52.0)
Hemoglobin: 14.7 g/dL (ref 13.0–17.0)
Immature Granulocytes: 0 %
Lymphocytes Relative: 26 %
Lymphs Abs: 1.5 10*3/uL (ref 0.7–4.0)
MCH: 32.4 pg (ref 26.0–34.0)
MCHC: 32.6 g/dL (ref 30.0–36.0)
MCV: 99.3 fL (ref 80.0–100.0)
Monocytes Absolute: 0.6 10*3/uL (ref 0.1–1.0)
Monocytes Relative: 11 %
Neutro Abs: 3.4 10*3/uL (ref 1.7–7.7)
Neutrophils Relative %: 58 %
Platelets: 190 10*3/uL (ref 150–400)
RBC: 4.54 MIL/uL (ref 4.22–5.81)
RDW: 13.8 % (ref 11.5–15.5)
WBC: 5.7 10*3/uL (ref 4.0–10.5)
nRBC: 0 % (ref 0.0–0.2)

## 2019-01-24 LAB — MAGNESIUM: Magnesium: 2.4 mg/dL (ref 1.7–2.4)

## 2019-01-24 NOTE — Progress Notes (Signed)
Marland Kitchen  PROGRESS NOTE    Jason Curry  WUJ:[id number] DOB: [birth year] DOA: 01/22/2019 PCP: Lucky Cowboy, MD   Brief Narrative:   Jason Curry a 48 y.o.malewith medical history significant ofHLD. He presents with abdominal pain. He reports his symptoms began 2 days ago. He woke up with a general feeling of gas and bloating. It continued throughout the day. He noted at the time that he didn't have an appetite, but he did not have N/V. This sensation continued into yesterday and today; increasing in intensity and resulting in pain along the LLQ of the abdomen. He notes that he tried tums and "gas medicine"; but it did not help. Although his appetite has been poor, he did try to eat. Eating did not cause any additional pain or nausea. He reports diarrhea that has been persistent since Friday. Initially it was a watery color and consistency, but it has been darker today. He became concerned and came to the ED.   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 zosyn    Subjective: "I'm hungry."  Objective: Vitals:   01/23/19 0434 01/23/19 1353 01/23/19 2106 01/24/19 0437  BP: 110/78 (!) 127/91 121/87 104/81  Pulse: 71 75 78 64   Resp: 16 15 18 16   Temp: 98.1 F (36.7 C) 98.6 F (37 C) 98.6 F (37 C) 97.8 F (36.6 C)  TempSrc: Oral Oral Oral Oral  SpO2: 99% 100% 98% 99%  Weight:      Height:        Intake/Output Summary (Last 24 hours) at 01/24/2019 1102 Last data filed at 01/24/2019 1000 Gross per 24 hour  Intake 3088.91 ml  Output 500 ml  Net 2588.91 ml   Filed Weights   01/22/19 1658  Weight: 92.6 kg    Examination:  General: 48 y.o. male resting in bed in NAD Cardiovascular: RRR, +S1, S2, no m/g/r, equal pulses throughout Respiratory: CTABL, no w/r/r, normal WOB GI: BS+, ND,TTP LLQ improving, no masses noted, no organomegaly noted,  MSK: No e/c/c Skin: No rashes, bruises, ulcerations noted Neuro: A&O x 3, no focal deficits Psyc: Appropriate interaction and affect, calm/cooperative    Data Reviewed: I have personally reviewed following labs and imaging studies.  CBC: Recent Labs  Lab 01/22/19 1100 01/23/19 0406 01/24/19 0354  WBC 10.5 8.2 5.7  NEUTROABS 8.6* 5.6 3.4  HGB 13.2 12.0* 14.7  HCT 39.0 36.0* 45.1  MCV 97.5 98.9 99.3  PLT 168 170 190   Basic Metabolic Panel: Recent Labs  Lab 01/22/19 1100 01/23/19 0406 01/24/19 0354  NA 140 140 141  K 3.3* 3.7 4.5  CL 103 106 106  CO2 24 25 24   GLUCOSE 123* 89 81  BUN  14 11 8   CREATININE 0.83 0.88 0.89  CALCIUM 8.9 8.3* 9.0  MG  --  2.1 2.4  PHOS  --   --  3.5   GFR: Estimated Creatinine Clearance: 108.1 mL/min (by C-G formula based on SCr of 0.89 mg/dL). Liver Function Tests: Recent Labs  Lab 01/22/19 1100 01/23/19 0406 01/24/19 0354  AST 18 18  --   ALT 27 23  --   ALKPHOS 91 72  --   BILITOT 1.2 0.9  --   PROT 7.9 6.5  --   ALBUMIN 4.2 3.4* 4.0   Recent Labs  Lab 01/22/19 1100  LIPASE 20   No results for input(s): AMMONIA in the last 168 hours. Coagulation Profile: No results for input(s): INR, PROTIME in the last 168 hours. Cardiac Enzymes: No results for input(s): CKTOTAL, CKMB, CKMBINDEX, TROPONINI  in the last 168 hours. BNP (last 3 results) No results for input(s): PROBNP in the last 8760 hours. HbA1C: No results for input(s): HGBA1C in the last 72 hours. CBG: No results for input(s): GLUCAP in the last 168 hours. Lipid Profile: No results for input(s): CHOL, HDL, LDLCALC, TRIG, CHOLHDL, LDLDIRECT in the last 72 hours. Thyroid Function Tests: No results for input(s): TSH, T4TOTAL, FREET4, T3FREE, THYROIDAB in the last 72 hours. Anemia Panel: No results for input(s): VITAMINB12, FOLATE, FERRITIN, TIBC, IRON, RETICCTPCT in the last 72 hours. Sepsis Labs: No results for input(s): PROCALCITON, LATICACIDVEN in the last 168 hours.  Recent Results (from the past 240 hour(s))  SARS Coronavirus 2 (Hosp order,Performed in [facility] lab via Abbott ID)     Status: None   Collection Time: 01/22/19 12:30 PM   Specimen: Dry Nasal Swab (Abbott ID Now)  Result Value Ref Range Status   SARS Coronavirus 2 (Abbott ID Now) NEGATIVE NEGATIVE Final    Comment: (NOTE) SARS-CoV-2 target nucleic acids are NOT DETECTED. The SARS-CoV-2 RNA is generally detectable in upper and lower respiratory specimens during the acute phase of infection.  Negativeresults do not preclude SARS-CoV-2 infection, do not rule out coinfections with other pathogens, and should not be used as the  sole basis for treatment or other patient management decisions.  Negative results must be combined with clinical observations, patient history, and epidemiological information. The expected result is Negative. Fact Sheet for Patients: http://www.graves-ford.org/ Fact Sheet for Healthcare Providers: EnviroConcern.si This test is not yet approved or cleared by the [place] FDA and  has been authorized for detection and/or diagnosis of SARS-CoV-2 by FDA under an Emergency Use Authorization (EUA).  This EUA will remain in effect (meaning this test can be used) for the duration of  the  COVID19 declaration under Section 5 64(b)(1) of the Act, 21 U.S.C.  section [phone] 3(b)(1), unless the authorization is terminated or revoked sooner. Performed at [facility], [address] [place], [place] [address]   SARS Coronavirus 2 (CEPHEID - Performed in [facility] hospital lab), Hosp Order     Status: None   Collection Time: 01/22/19  4:44 PM   Specimen: Nasopharyngeal Swab  Result Value Ref Range Status   SARS Coronavirus 2 NEGATIVE NEGATIVE Final    Comment: (NOTE) If result is NEGATIVE SARS-CoV-2 target nucleic acids are NOT DETECTED. The SARS-CoV-2 RNA is generally detectable in upper and lower  respiratory specimens during the acute phase of infection. The lowest  concentration of SARS-CoV-2 viral copies this assay can detect is 250  copies / mL. A negative result does not preclude SARS-CoV-2  infection  and should not be used as the sole basis for treatment or other  patient management decisions.  A negative result may occur with  improper specimen collection / handling, submission of specimen other  than nasopharyngeal swab, presence of viral mutation(s) within the  areas targeted by this assay, and inadequate number of viral copies  (<250 copies / mL). A negative result must be combined with clinical  observations, patient history, and epidemiological information. If result is POSITIVE SARS-CoV-2 target nucleic acids are DETECTED. The SARS-CoV-2 RNA is generally detectable in upper and lower  respiratory specimens dur ing the acute phase of infection.  Positive  results are indicative of active infection with SARS-CoV-2.  Clinical  correlation with patient history and other diagnostic information is  necessary to determine patient infection status.  Positive results do  not rule out bacterial infection or co-infection with other viruses. If result is PRESUMPTIVE POSTIVE SARS-CoV-2 nucleic acids MAY BE PRESENT.   A presumptive positive result was  obtained on the submitted specimen  and confirmed on repeat testing.  While 2019 novel coronavirus  (SARS-CoV-2) nucleic acids may be present in the submitted sample  additional confirmatory testing may be necessary for epidemiological  and / or clinical management purposes  to differentiate between  SARS-CoV-2 and other Sarbecovirus currently known to infect humans.  If clinically indicated additional testing with an alternate test  methodology [phone]) is advised. The SARS-CoV-2 RNA is generally  detectable in upper and lower respiratory sp ecimens during the acute  phase of infection. The expected result is Negative. Fact Sheet for Patients:  BoilerBrush.com.cy Fact Sheet for Healthcare Providers: https://pope.com/ This test is not yet approved or cleared by the [place] FDA and has been authorized for detection and/or diagnosis of SARS-CoV-2 by FDA under an Emergency Use Authorization (EUA).  This EUA will remain in effect (meaning this test can be used) for the duration of the COVID-19 declaration under Section 564(b)(1) of the Act, 21 U.S.C. section 360bbb-3(b)(1), unless the authorization is terminated or revoked sooner. Performed at [facility], [address] [place], [place] [address]   Culture, blood (routine x 2)     Status: None (Preliminary result)   Collection Time: 01/22/19  5:14 PM   Specimen: BLOOD  Result Value Ref Range Status   Specimen Description   Final    BLOOD LEFT ANTECUBITAL Performed at [facility], [address], [place], [place] [address]    Special Requests   Final    BOTTLES DRAWN AEROBIC ONLY Blood Culture adequate volume Performed at [facility], [address] [place], [place] [address]    Culture   Final    NO GROWTH < 24 HOURS Performed at [facility], [address] [place], [place] [address]    Report Status  PENDING  Incomplete  Culture, blood (routine x 2)     Status: None (Preliminary result)   Collection Time: 01/22/19  5:20 PM   Specimen: BLOOD LEFT HAND  Result Value Ref Range Status   Specimen Description   Final    BLOOD LEFT HAND Performed at [facility], [address] [place], [place] [address]    Special Requests   Final    BOTTLES DRAWN AEROBIC ONLY Blood Culture adequate volume Performed at [facility], [address] [place], [place] [address]    Culture   Final    NO GROWTH < 24 HOURS Performed  at [facility], [address], [place], [place] [address]    Report Status PENDING  Incomplete         Radiology Studies: Ct Abdomen Pelvis W Contrast  Result Date: 01/22/2019 CLINICAL DATA:  Abdominal pain and diarrhea EXAM: CT ABDOMEN AND PELVIS WITH CONTRAST TECHNIQUE: Multidetector CT imaging of the abdomen and pelvis was performed using the standard protocol following bolus administration of intravenous contrast. Oral contrast was also administered. CONTRAST:  OMNIPAQUE IOHEXOL 300 MG/ML  SOLN COMPARISON:  None. FINDINGS: Lower chest: Lung bases are clear. Hepatobiliary: No focal liver lesions are appreciable. The gallbladder wall is not appreciably thickened. There is no biliary duct dilatation. Pancreas: There is no pancreatic mass or inflammatory focus. Spleen: No splenic lesions are evident. Adrenals/Urinary Tract: Adrenals bilaterally appear normal. There is an 8 x 8 mm cyst in the upper pole of the right kidney. There is no appreciable hydronephrosis on either side. There is no evident renal or ureteral calculus on either side. Urinary bladder is midline. Urinary bladder wall is mildly thickened. Stomach/Bowel: There is extensive thickening of the wall of the mid sigmoid colon with soft tissue stranding and mild fluid consistent with diverticulitis. There is a site of microperforation in this area. No abscess seen.  Elsewhere, there is no bowel wall or mesenteric thickening. No bowel obstruction evident. Terminal ileum appears unremarkable. There is lipomatous infiltration of the ileocecal valve. Beyond the microperforation at the site of diverticulitis, there is no evident free air. No portal venous air. Vascular/Lymphatic: There is no abdominal aortic aneurysm. No vascular lesions are evident. There is no adenopathy in the abdomen or pelvis. Reproductive: Prostate and seminal vesicles are normal in size and configuration. No evident pelvic mass. Other: Appendix appears normal. No abscess or ascites evident in the abdomen or pelvis. There is a small ventral hernia containing only fat. Musculoskeletal: There is degenerative change at L5-S1. There are no blastic or lytic bone lesions. There is no intramuscular lesion. IMPRESSION: 1. Diverticulitis in the mid sigmoid colon. Focus of microperforation evident. No abscess. 2. No bowel obstruction. No abscess in the abdomen or pelvis. Appendix appears normal. 3. There is thickening of the urinary bladder wall, a finding felt to represent a degree of cystitis. 4.  No appreciable renal or ureteral calculus.  No hydronephrosis. 5.  Small ventral hernia containing only fat. Critical Value/emergent results were called by telephone at the time of interpretation on 01/22/2019 at 12:12 pm to Dr. Alvira Monday , who verbally acknowledged these results. Electronically Signed   By: Bretta Bang III M.D.   On: 01/22/2019 12:13        Scheduled Meds: . aspirin EC  81 mg Oral QHS  . cholecalciferol  2,000 Units Oral Daily  . enoxaparin (LOVENOX) injection  40 mg Subcutaneous Q24H  . magnesium oxide  200 mg Oral QHS  . rosuvastatin  40 mg Oral QHS  . vitamin B-12  1,000 mcg Oral QHS  . zinc sulfate  220 mg Oral QHS   Continuous Infusions: . sodium chloride 100 mL/hr at 01/24/19 0950  . piperacillin-tazobactam (ZOSYN)  IV 3.375 g (01/24/19 0359)     LOS: 2 days    Time  spent: 25 minutes spent in the coordination of care today.    Teddy Spike, DO [facility] Pager [phone]  If 7PM-7AM, please contact night-coverage [email or web address] Password [email or web address] 01/24/2019, 11:02 AM

## 2019-01-24 NOTE — Progress Notes (Addendum)
[facility] [place] [facility] Progress Note     Subjective: CC-  Feeling better today. He reports no pain only intermittent mild abdominal soreness located suprapubic. Denies n/v. Tolerating clear liquids without increased pain.   He has had 2 BMs this morning, states that they are starting to bulk up a little bit.  WBC 5.7, VSS.  Objective: Vital signs in last 24 hours: Temp:  [97.8 F (36.6 C)-98.6 F (37 C)] 97.8 F (36.6 C) (07/14 0437) Pulse Rate:  [64-78] 64 (07/14 0437) Resp:  [15-18] 16 (07/14 0437) BP: (104-127)/(81-91) 104/81 (07/14 0437) SpO2:  [98 %-100 %] 99 % (07/14 0437) Last BM Date: 01/23/19  Intake/Output from previous day: 07/13 0701 - 07/14 0700 In: 2866.2 [P.O.:360; I.V.:2361.5; IV Piggyback:144.8] Out: -  Intake/Output this shift: No intake/output data recorded.  PE: Gen:  Alert, NAD, pleasant HEENT: EOM's intact, pupils equal and round Pulm:  Rate and effort normal Abd: soft, ND, +BS, no masses or organomegaly, abdomen nontender Psych: A&Ox3  Skin: no rashes noted, warm and dry  Lab Results:  Recent Labs    01/23/19 0406 01/24/19 0354  WBC 8.2 5.7  HGB 12.0* 14.7  HCT 36.0* 45.1  PLT 170 190   BMET Recent Labs    01/23/19 0406 01/24/19 0354  NA 140 141  K 3.7 4.5  CL 106 106  CO2 25 24  GLUCOSE 89 81  BUN 11 8  CREATININE 0.88 0.89  CALCIUM 8.3* 9.0   PT/INR No results for input(s): LABPROT, INR in the last 72 hours. CMP     Component Value Date/Time   NA 141 01/24/2019 0354   K 4.5 01/24/2019 0354   CL 106 01/24/2019 0354   CO2 24 01/24/2019 0354   GLUCOSE 81 01/24/2019 0354   BUN 8 01/24/2019 0354   CREATININE 0.89 01/24/2019 0354   CREATININE 0.88 12/27/2018 1609   CALCIUM 9.0 01/24/2019 0354   PROT 6.5 01/23/2019 0406   ALBUMIN 4.0 01/24/2019 0354   AST 18 01/23/2019 0406   ALT 23 01/23/2019 0406   ALKPHOS 72 01/23/2019 0406   BILITOT 0.9 01/23/2019 0406   GFRNONAA >60 01/24/2019 0354   GFRNONAA 102 12/27/2018  1609   GFRAA >60 01/24/2019 0354   GFRAA 118 12/27/2018 1609   Lipase     Component Value Date/Time   LIPASE 20 01/22/2019 1100       Studies/Results: Ct Abdomen Pelvis W Contrast  Result Date: 01/22/2019 CLINICAL DATA:  Abdominal pain and diarrhea EXAM: CT ABDOMEN AND PELVIS WITH CONTRAST TECHNIQUE: Multidetector CT imaging of the abdomen and pelvis was performed using the standard protocol following bolus administration of intravenous contrast. Oral contrast was also administered. CONTRAST:  152mL OMNIPAQUE IOHEXOL 300 MG/ML  SOLN COMPARISON:  None. FINDINGS: Lower chest: Lung bases are clear. Hepatobiliary: No focal liver lesions are appreciable. The gallbladder wall is not appreciably thickened. There is no biliary duct dilatation. Pancreas: There is no pancreatic mass or inflammatory focus. Spleen: No splenic lesions are evident. Adrenals/Urinary Tract: Adrenals bilaterally appear normal. There is an 8 x 8 mm cyst in the upper pole of the right kidney. There is no appreciable hydronephrosis on either side. There is no evident renal or ureteral calculus on either side. Urinary bladder is midline. Urinary bladder wall is mildly thickened. Stomach/Bowel: There is extensive thickening of the wall of the mid sigmoid colon with soft tissue stranding and mild fluid consistent with diverticulitis. There is a site of microperforation in this area. No abscess seen.  Elsewhere, there is no bowel wall or mesenteric thickening. No bowel obstruction evident. Terminal ileum appears unremarkable. There is lipomatous infiltration of the ileocecal valve. Beyond the microperforation at the site of diverticulitis, there is no evident free air. No portal venous air. Vascular/Lymphatic: There is no abdominal aortic aneurysm. No vascular lesions are evident. There is no adenopathy in the abdomen or pelvis. Reproductive: Prostate and seminal vesicles are normal in size and configuration. No evident pelvic mass. Other:  Appendix appears normal. No abscess or ascites evident in the abdomen or pelvis. There is a small ventral hernia containing only fat. Musculoskeletal: There is degenerative change at L5-S1. There are no blastic or lytic bone lesions. There is no intramuscular lesion. IMPRESSION: 1. Diverticulitis in the mid sigmoid colon. Focus of microperforation evident. No abscess. 2. No bowel obstruction. No abscess in the abdomen or pelvis. Appendix appears normal. 3. There is thickening of the urinary bladder wall, a finding felt to represent a degree of cystitis. 4.  No appreciable renal or ureteral calculus.  No hydronephrosis. 5.  Small ventral hernia containing only fat. Critical Value/emergent results were called by telephone at the time of interpretation on 01/22/2019 at 12:12 pm to Dr. Alvira MondayERIN SCHLOSSMAN , who verbally acknowledged these results. Electronically Signed   By: Bretta BangWilliam  Woodruff III M.D.   On: 01/22/2019 12:13    Anti-infectives: Anti-infectives (From admission, onward)   Start     Dose/Rate Route Frequency Ordered Stop   01/22/19 2000  piperacillin-tazobactam (ZOSYN) IVPB 3.375 g     3.375 g 12.5 mL/hr over 240 Minutes Intravenous Every 8 hours 01/22/19 1655     01/22/19 1300  piperacillin-tazobactam (ZOSYN) IVPB 3.375 g     3.375 g 12.5 mL/hr over 240 Minutes Intravenous  Once 01/22/19 1224 01/22/19 1320       Assessment/Plan HLD  Chronic hematuria  Sigmoid diverticulitis with microperforation             - 1st bout of diverticulitis             - never had a colonoscopy before             - if this resolves medically we will plan for a colonoscopy in 6-8 weeks  ID - zosyn 7/12>>day#3 VTE - SCDs, lovenox FEN - IVF, FLD Foley - none Follow up - TBD  Plan: Clinically improving. Advance to full liquids. Continue IV zosyn. Mobilize.  If he is doing well tomorrow will likely advance diet and transition to oral antibiotics.   LOS: 2 days    Franne FortsBrooke A Meuth , [facility] 01/24/2019, 9:12 AM Pager: [phone]  Agree with above. Progressing quickly.  Ovidio Kinavid Leveon Pelzer, MD, [facility] Pager: [phone] Office phone:  [phone]

## 2019-01-25 DIAGNOSIS — K5792 Diverticulitis of intestine, part unspecified, without perforation or abscess without bleeding: Secondary | ICD-10-CM

## 2019-01-25 DIAGNOSIS — E876 Hypokalemia: Secondary | ICD-10-CM

## 2019-01-25 DIAGNOSIS — E782 Mixed hyperlipidemia: Secondary | ICD-10-CM

## 2019-01-25 LAB — CBC WITH DIFFERENTIAL/PLATELET
Abs Immature Granulocytes: 0.01 10*3/uL (ref 0.00–0.07)
Basophils Absolute: 0 10*3/uL (ref 0.0–0.1)
Basophils Relative: 1 %
Eosinophils Absolute: 0.2 10*3/uL (ref 0.0–0.5)
Eosinophils Relative: 4 %
HCT: 37.8 % — ABNORMAL LOW (ref 39.0–52.0)
Hemoglobin: 12.4 g/dL — ABNORMAL LOW (ref 13.0–17.0)
Immature Granulocytes: 0 %
Lymphocytes Relative: 25 %
Lymphs Abs: 1.5 10*3/uL (ref 0.7–4.0)
MCH: 32.4 pg (ref 26.0–34.0)
MCHC: 32.8 g/dL (ref 30.0–36.0)
MCV: 98.7 fL (ref 80.0–100.0)
Monocytes Absolute: 0.8 10*3/uL (ref 0.1–1.0)
Monocytes Relative: 12 %
Neutro Abs: 3.6 10*3/uL (ref 1.7–7.7)
Neutrophils Relative %: 58 %
Platelets: 208 10*3/uL (ref 150–400)
RBC: 3.83 MIL/uL — ABNORMAL LOW (ref 4.22–5.81)
RDW: 13.5 % (ref 11.5–15.5)
WBC: 6.1 10*3/uL (ref 4.0–10.5)
nRBC: 0 % (ref 0.0–0.2)

## 2019-01-25 LAB — RENAL FUNCTION PANEL
Albumin: 3.3 g/dL — ABNORMAL LOW (ref 3.5–5.0)
Anion gap: 8 (ref 5–15)
BUN: 8 mg/dL (ref 6–20)
CO2: 26 mmol/L (ref 22–32)
Calcium: 8.7 mg/dL — ABNORMAL LOW (ref 8.9–10.3)
Chloride: 106 mmol/L (ref 98–111)
Creatinine, Ser: 0.95 mg/dL (ref 0.61–1.24)
GFR calc Af Amer: >60 mL/min (ref >60)
GFR calc non Af Amer: >60 mL/min (ref >60)
Glucose, Bld: 80 mg/dL (ref 70–99)
Phosphorus: 3.6 mg/dL (ref 2.5–4.6)
Potassium: 4 mmol/L (ref 3.5–5.1)
Sodium: 140 mmol/L (ref 135–145)

## 2019-01-25 LAB — MAGNESIUM: Magnesium: 2.2 mg/dL (ref 1.7–2.4)

## 2019-01-25 MED ORDER — TRAMADOL HCL 50 MG PO TABS: 50.0000 mg | ORAL_TABLET | Freq: Four times a day (QID) | ORAL | 0 refills | Status: DC | PRN

## 2019-01-25 MED ORDER — AMOXICILLIN-POT CLAVULANATE 875-125 MG PO TABS
1.0000 | ORAL_TABLET | Freq: Two times a day (BID) | ORAL | Status: DC
  Administered 2019-01-25: 1 via ORAL
  Filled 2019-01-25: qty 1

## 2019-01-25 MED ORDER — AMOXICILLIN-POT CLAVULANATE 875-125 MG PO TABS: 1.0000 | ORAL_TABLET | Freq: Two times a day (BID) | ORAL | 0 refills | Status: AC

## 2019-01-25 NOTE — Discharge Summary (Signed)
Physician Discharge Summary  MINER KORAL GYI:[id number] DOB: Mar 29, 1971 DOA: 01/22/2019  PCP: Unk Pinto, MD  Admit date: 01/22/2019 Discharge date: 01/25/2019  Admitted From: Home Disposition:  Home  Recommendations for Outpatient Follow-up:  1. Follow up with PCP in 1-2 weeks 2. Recommend referral for follow up outpatient colonoscopy in 6-8 weeks  Discharge Condition:Stable CODE STATUS:Full Diet recommendation: Soft, advance to regular as tolerated   Brief/Interim Summary: 48 y.o.malewith medical history significant ofHLD. He presents with abdominal pain. He reports his symptoms began 2 days ago. He woke up with a general feeling of gas and bloating. It continued throughout the day. He noted at the time that he didn't have an appetite, but he did not have N/V. This sensation continued into yesterday and today; increasing in intensity and resulting in pain along the LLQ of the abdomen. He notes that he tried tums and "gas medicine"; but it did not help. Although his appetite has been poor, he did try to eat. Eating did not cause any additional pain or nausea. He reports diarrhea that has been persistent since Friday. Initially it was a watery color and consistency, but it has been darker today. He became concerned and came to the ED.   Discharge Diagnoses:  Principal Problem:   Acute diverticulitis Active Problems:   Hyperlipidemia, mixed   Hypokalemia  Acute diverticulitis - surgery has been consulted per physician checkout, appreciate assistance - microperforation noted on CT -Patient was continued on IV abx (zosyn), and analgesia -Clinically improved with abx and diet was successfully advanced to soft diet     - Transitioned to oral abx. 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   aspirin 81 MG tablet Take 81 mg by mouth at bedtime.   ibuprofen 200 MG tablet Commonly known as: ADVIL Take 400 mg by mouth daily as needed for headache.   Magnesium 200 MG Tabs Take 1 tablet by mouth at bedtime.   rosuvastatin 40 MG tablet Commonly known as: CRESTOR Takes 1/2 to 1 tablet daily for cholesterol What changed:   how much to take  how to take this  when to take this  additional instructions   traMADol 50 MG tablet Commonly known as: ULTRAM Take 1 tablet (50 mg total) by mouth every 6 (six) hours as needed for moderate pain.   VITA-C PO Take 1 tablet by mouth daily.   VITAMIN B-12 SL Place 1 tablet under the tongue at bedtime.   Vitamin D 50 MCG (2000 UT) Caps Take 2 capsules by mouth daily.   zinc gluconate 50 MG tablet Take 50 mg by mouth at bedtime.      Follow-up Information    Unk Pinto, MD. Call.   Specialty: Internal Medicine Why: Call to arrange post-hospitalization follow up appointment with your PCP in 2-3 weeks. He can get you set up with a gastroenterologist for a colonoscopy in 6-8 weeks. Contact information: [address] [place] [address] [facility], [place]. Call.  Specialty: General Surgery Why: as needed Contact information: [address] [place] [address] [phone]         No Known Allergies  Consultations:  General Surgery  Procedures/Studies: Ct Abdomen Pelvis W Contrast  Result Date: 01/22/2019 CLINICAL DATA:  Abdominal pain and diarrhea EXAM: CT ABDOMEN AND PELVIS WITH CONTRAST TECHNIQUE: Multidetector CT imaging of the abdomen and  pelvis was performed using the standard protocol following bolus administration of intravenous contrast. Oral contrast was also administered. CONTRAST:  OMNIPAQUE IOHEXOL 300 MG/ML  SOLN COMPARISON:  None. FINDINGS: Lower chest: Lung bases are clear. Hepatobiliary: No focal liver lesions are appreciable. The gallbladder wall is not appreciably thickened. There is no biliary duct dilatation. Pancreas: There is no pancreatic mass or inflammatory focus. Spleen: No splenic lesions are evident. Adrenals/Urinary Tract: Adrenals bilaterally appear normal. There is an 8 x 8 mm cyst in the upper pole of the right kidney. There is no appreciable hydronephrosis on either side. There is no evident renal or ureteral calculus on either side. Urinary bladder is midline. Urinary bladder wall is mildly thickened. Stomach/Bowel: There is extensive thickening of the wall of the mid sigmoid colon with soft tissue stranding and mild fluid consistent with diverticulitis. There is a site of microperforation in this area. No abscess seen. Elsewhere, there is no bowel wall or mesenteric thickening. No bowel obstruction evident. Terminal ileum appears unremarkable. There is lipomatous infiltration of the ileocecal valve. Beyond the microperforation at the site of diverticulitis, there is no evident free air. No portal venous air. Vascular/Lymphatic: There is no abdominal aortic aneurysm. No vascular lesions are evident. There is no adenopathy in the abdomen or pelvis. Reproductive: Prostate and seminal vesicles are normal in size and configuration. No evident pelvic mass. Other: Appendix appears normal. No abscess or ascites evident in the abdomen or pelvis. There is a small ventral hernia containing only fat. Musculoskeletal: There is degenerative change at L5-S1. There are no blastic or lytic bone lesions. There is no intramuscular lesion. IMPRESSION: 1. Diverticulitis in the mid sigmoid colon. Focus of microperforation evident. No  abscess. 2. No bowel obstruction. No abscess in the abdomen or pelvis. Appendix appears normal. 3. There is thickening of the urinary bladder wall, a finding felt to represent a degree of cystitis. 4.  No appreciable renal or ureteral calculus.  No hydronephrosis. 5.  Small ventral hernia containing only fat. Critical Value/emergent results were called by telephone at the time of interpretation on 01/22/2019 at 12:12 pm to Dr. Alvira Monday , who verbally acknowledged these results. Electronically Signed   By: Bretta Bang III M.D.   On: 01/22/2019 12:13     Subjective: Eager to go home  Discharge Exam: Vitals:   01/25/19 0503 01/25/19 1258  BP: 100/78 122/88  Pulse: 61 76  Resp: 15 18  Temp: 98 F (36.7 C) 98.7 F (37.1 C)  SpO2: 97% 98%   Vitals:   01/24/19 1409 01/24/19 2036 01/25/19 0503 01/25/19 1258  BP: 114/90 (!) 128/94 100/78 122/88  Pulse: 80 76 61 76  Resp: Temp: 98.3 F (36.8 C) 98.6 F (37 C) 98 F (36.7 C) 98.7 F (37.1 C)  TempSrc: Oral Oral Oral Oral  SpO2: 100% 99% 97% 98%  Weight:      Height:        General: Pt is alert, awake, not in acute distress Cardiovascular: RRR, S1/S2 +, no rubs, no gallops Respiratory: CTA bilaterally, no  wheezing, no rhonchi Abdominal: Soft, NT, ND, bowel sounds + Extremities: no edema, no cyanosis   The results of significant diagnostics from this hospitalization (including imaging, microbiology, ancillary and laboratory) are listed below for reference.     Microbiology: Recent Results (from the past 240 hour(s))  SARS Coronavirus 2 (Hosp order,Performed in [facility] lab via Abbott ID)     Status: None   Collection Time: 01/22/19 12:30 PM   Specimen: Dry Nasal Swab (Abbott ID Now)  Result Value Ref Range Status   SARS Coronavirus 2 (Abbott ID Now) NEGATIVE NEGATIVE Final    Comment: (NOTE) SARS-CoV-2 target nucleic acids are NOT DETECTED. The SARS-CoV-2 RNA is generally detectable in upper and  lower respiratory specimens during the acute phase of infection.  Negativeresults do not preclude SARS-CoV-2 infection, do not rule out coinfections with other pathogens, and should not be used as the  sole basis for treatment or other patient management decisions.  Negative results must be combined with clinical observations, patient history, and epidemiological information. The expected result is Negative. Fact Sheet for Patients: http://www.graves-ford.org/ Fact Sheet for Healthcare Providers: EnviroConcern.si This test is not yet approved or cleared by the [place] FDA and  has been authorized for detection and/or diagnosis of SARS-CoV-2 by FDA under an Emergency Use Authorization (EUA).  This EUA will remain in effect (meaning this test can be used) for the duration of  the COVID19 declaration under Section 5 64(b)(1) of the Act, 21 U.S.C.  section [phone] 3(b)(1), unless the authorization is terminated or revoked sooner. Performed at [facility], [address] [place], [place] [address]   SARS Coronavirus 2 (CEPHEID - Performed in [facility] hospital lab), Hosp Order     Status: None   Collection Time: 01/22/19  4:44 PM   Specimen: Nasopharyngeal Swab  Result Value Ref Range Status   SARS Coronavirus 2 NEGATIVE NEGATIVE Final    Comment: (NOTE) If result is NEGATIVE SARS-CoV-2 target nucleic acids are NOT DETECTED. The SARS-CoV-2 RNA is generally detectable in upper and lower  respiratory specimens during the acute phase of infection. The lowest  concentration of SARS-CoV-2 viral copies this assay can detect is 250  copies / mL. A negative result does not preclude SARS-CoV-2 infection  and should not be used as the sole basis for treatment or other  patient management decisions.  A negative result may occur with  improper specimen collection / handling, submission of specimen other  than nasopharyngeal swab,  presence of viral mutation(s) within the  areas targeted by this assay, and inadequate number of viral copies  (<250 copies / mL). A negative result must be combined with clinical  observations, patient history, and epidemiological information. If result is POSITIVE SARS-CoV-2 target nucleic acids are DETECTED. The SARS-CoV-2 RNA is generally detectable in upper and lower  respiratory specimens dur ing the acute phase of infection.  Positive  results are indicative of active infection with SARS-CoV-2.  Clinical  correlation with patient history and other diagnostic information is  necessary to determine patient infection status.  Positive results do  not rule out bacterial infection or co-infection with other viruses. If result is PRESUMPTIVE POSTIVE SARS-CoV-2 nucleic acids MAY BE PRESENT.   A presumptive positive result was obtained on the submitted specimen  and confirmed on repeat testing.  While 2019 novel coronavirus  (SARS-CoV-2) nucleic acids may be present in the submitted sample  additional confirmatory testing may be necessary for epidemiological  and /  or clinical management purposes  to differentiate between  SARS-CoV-2 and other Sarbecovirus currently known to infect humans.  If clinically indicated additional testing with an alternate test  methodology [phone] is advised. The SARS-CoV-2 RNA is generally  detectable in upper and lower respiratory sp ecimens during the acute  phase of infection. The expected result is Negative. Fact Sheet for Patients:  BoilerBrush.com.cyhttps://www.fda.gov/media/136312/download Fact Sheet for Healthcare Providers: https://pope.com/https://www.fda.gov/media/136313/download This test is not yet approved or cleared by the [place] FDA and has been authorized for detection and/or diagnosis of SARS-CoV-2 by FDA under an Emergency Use Authorization (EUA).  This EUA will remain in effect (meaning this test can be used) for the duration of the COVID-19 declaration under  Section 564(b)(1) of the Act, 21 U.S.C. section 360bbb-3(b)(1), unless the authorization is terminated or revoked sooner. Performed at [facility], [address] [place], [place] [address]   Culture, blood (routine x 2)     Status: None (Preliminary result)   Collection Time: 01/22/19  5:14 PM   Specimen: BLOOD  Result Value Ref Range Status   Specimen Description   Final    BLOOD LEFT ANTECUBITAL Performed at [facility], [address] [place], [place] [address]    Special Requests   Final    BOTTLES DRAWN AEROBIC ONLY Blood Culture adequate volume Performed at [facility], [address] [place], [place] [address]    Culture   Final    NO GROWTH 2 DAYS Performed at [facility], [address] [place], [place] [address]    Report Status PENDING  Incomplete  Culture, blood (routine x 2)     Status: None (Preliminary result)   Collection Time: 01/22/19  5:20 PM   Specimen: BLOOD LEFT HAND  Result Value Ref Range Status   Specimen Description   Final    BLOOD LEFT HAND Performed at [facility], [address] [place], [place] [address]    Special Requests   Final    BOTTLES DRAWN AEROBIC ONLY Blood Culture adequate volume Performed at [facility], [address] [place], [place] [address]    Culture   Final    NO GROWTH 2 DAYS Performed at [facility], [address] [place], [place] [address]    Report Status PENDING  Incomplete     Labs: BNP (last 3 results) No results for input(s): BNP in the last 8760 hours. Basic Metabolic Panel: Recent Labs  Lab 01/22/19 1100 01/23/19 0406 01/24/19 0354 01/25/19 0345  NA 140 140 141 140  K 3.3* 3.7 4.5 4.0  CL 103 106 106 106  CO2 24 25 24 26   GLUCOSE 123* 89 81 80  BUN 14 11 8 8   CREATININE 0.83 0.88 0.89 0.95  CALCIUM 8.9 8.3* 9.0 8.7*  MG  --  2.1 2.4 2.2  PHOS  --   --  3.5 3.6   Liver  Function Tests: Recent Labs  Lab 01/22/19 1100 01/23/19 0406 01/24/19 0354 01/25/19 0345  AST 18 18  --   --   ALT 27 23  --   --   ALKPHOS 91 72  --   --   BILITOT 1.2 0.9  --   --   PROT 7.9 6.5  --   --   ALBUMIN 4.2 3.4* 4.0 3.3*   Recent Labs  Lab 01/22/19 1100  LIPASE 20   No results for input(s): AMMONIA in  the last 168 hours. CBC: Recent Labs  Lab 01/22/19 1100 01/23/19 0406 01/24/19 0354 01/25/19 0345  WBC 10.5 8.2 5.7 6.1  NEUTROABS 8.6* 5.6 3.4 3.6  HGB 13.2 12.0* 14.7 12.4*  HCT 39.0 36.0* 45.1 37.8*  MCV 97.5 98.9 99.3 98.7  PLT 168 170 190 208   Cardiac Enzymes: No results for input(s): CKTOTAL, CKMB, CKMBINDEX, TROPONINI in the last 168 hours. BNP: Invalid input(s): POCBNP CBG: No results for input(s): GLUCAP in the last 168 hours. D-Dimer No results for input(s): DDIMER in the last 72 hours. Hgb A1c No results for input(s): HGBA1C in the last 72 hours. Lipid Profile No results for input(s): CHOL, HDL, LDLCALC, TRIG, CHOLHDL, LDLDIRECT in the last 72 hours. Thyroid function studies No results for input(s): TSH, T4TOTAL, T3FREE, THYROIDAB in the last 72 hours.  Invalid input(s): FREET3 Anemia work up No results for input(s): VITAMINB12, FOLATE, FERRITIN, TIBC, IRON, RETICCTPCT in the last 72 hours. Urinalysis    Component Value Date/Time   COLORURINE AMBER (A) 01/22/2019 1047   APPEARANCEUR CLOUDY (A) 01/22/2019 1047   LABSPEC >1.030 (H) 01/22/2019 1047   PHURINE 6.0 01/22/2019 1047   GLUCOSEU NEGATIVE 01/22/2019 1047   HGBUR LARGE (A) 01/22/2019 1047   BILIRUBINUR SMALL (A) 01/22/2019 1047   KETONESUR 40 (A) 01/22/2019 1047   PROTEINUR 100 (A) 01/22/2019 1047   UROBILINOGEN 0.2 05/15/2014 1611   NITRITE NEGATIVE 01/22/2019 1047   LEUKOCYTESUR NEGATIVE 01/22/2019 1047   Sepsis Labs Invalid input(s): PROCALCITONIN,  WBC,  LACTICIDVEN Microbiology Recent Results (from the past 240 hour(s))  SARS Coronavirus 2 (Hosp order,Performed in  [facility] lab via Abbott ID)     Status: None   Collection Time: 01/22/19 12:30 PM   Specimen: Dry Nasal Swab (Abbott ID Now)  Result Value Ref Range Status   SARS Coronavirus 2 (Abbott ID Now) NEGATIVE NEGATIVE Final    Comment: (NOTE) SARS-CoV-2 target nucleic acids are NOT DETECTED. The SARS-CoV-2 RNA is generally detectable in upper and lower respiratory specimens during the acute phase of infection.  Negativeresults do not preclude SARS-CoV-2 infection, do not rule out coinfections with other pathogens, and should not be used as the  sole basis for treatment or other patient management decisions.  Negative results must be combined with clinical observations, patient history, and epidemiological information. The expected result is Negative. Fact Sheet for Patients: http://www.graves-ford.org/https://www.fda.gov/media/136524/download Fact Sheet for Healthcare Providers: EnviroConcern.sihttps://www.fda.gov/media/136523/download This test is not yet approved or cleared by the [place] FDA and  has been authorized for detection and/or diagnosis of SARS-CoV-2 by FDA under an Emergency Use Authorization (EUA).  This EUA will remain in effect (meaning this test can be used) for the duration of  the COVID19 declaration under Section 5 64(b)(1) of the Act, 21 U.S.C.  section [id number] 3(b)(1), unless the authorization is terminated or revoked sooner. Performed at [facility], [address] [place], [place] [address]   SARS Coronavirus 2 (CEPHEID - Performed in [facility] hospital lab), Hosp Order     Status: None   Collection Time: 01/22/19  4:44 PM   Specimen: Nasopharyngeal Swab  Result Value Ref Range Status   SARS Coronavirus 2 NEGATIVE NEGATIVE Final    Comment: (NOTE) If result is NEGATIVE SARS-CoV-2 target nucleic acids are NOT DETECTED. The SARS-CoV-2 RNA is generally detectable in upper and lower  respiratory specimens during the acute phase of infection. The lowest  concentration of SARS-CoV-2  viral copies this assay can detect is 250  copies / mL. A negative result does not preclude SARS-CoV-2 infection  and should not be used as the sole basis for treatment or other  patient management decisions.  A negative result may occur with  improper specimen collection / handling, submission of specimen other  than nasopharyngeal swab, presence of viral mutation(s) within the  areas targeted by this assay, and inadequate number of viral copies  (<250 copies / mL). A negative result must be combined with clinical  observations, patient history, and epidemiological information. If result is POSITIVE SARS-CoV-2 target nucleic acids are DETECTED. The SARS-CoV-2 RNA is generally detectable in upper and lower  respiratory specimens dur ing the acute phase of infection.  Positive  results are indicative of active infection with SARS-CoV-2.  Clinical  correlation with patient history and other diagnostic information is  necessary to determine patient infection status.  Positive results do  not rule out bacterial infection or co-infection with other viruses. If result is PRESUMPTIVE POSTIVE SARS-CoV-2 nucleic acids MAY BE PRESENT.   A presumptive positive result was obtained on the submitted specimen  and confirmed on repeat testing.  While 2019 novel coronavirus  (SARS-CoV-2) nucleic acids may be present in the submitted sample  additional confirmatory testing may be necessary for epidemiological  and / or clinical management purposes  to differentiate between  SARS-CoV-2 and other Sarbecovirus currently known to infect humans.  If clinically indicated additional testing with an alternate test  methodology [phone]) is advised. The SARS-CoV-2 RNA is generally  detectable in upper and lower respiratory sp ecimens during the acute  phase of infection. The expected result is Negative. Fact Sheet for Patients:  BoilerBrush.com.cyhttps://www.fda.gov/media/136312/download Fact Sheet for Healthcare  Providers: https://pope.com/https://www.fda.gov/media/136313/download This test is not yet approved or cleared by the [place] FDA and has been authorized for detection and/or diagnosis of SARS-CoV-2 by FDA under an Emergency Use Authorization (EUA).  This EUA will remain in effect (meaning this test can be used) for the duration of the COVID-19 declaration under Section 564(b)(1) of the Act, 21 U.S.C. section 360bbb-3(b)(1), unless the authorization is terminated or revoked sooner. Performed at [facility], [address] [place], [place] [address]   Culture, blood (routine x 2)     Status: None (Preliminary result)   Collection Time: 01/22/19  5:14 PM   Specimen: BLOOD  Result Value Ref Range Status   Specimen Description   Final    BLOOD LEFT ANTECUBITAL Performed at [facility], [address] [place], [place] [address]    Special Requests   Final    BOTTLES DRAWN AEROBIC ONLY Blood Culture adequate volume Performed at [facility], [address] [place], [place] [address]    Culture   Final    NO GROWTH 2 DAYS Performed at [facility], [address] [place], [place] [address]    Report Status PENDING  Incomplete  Culture, blood (routine x 2)     Status: None (Preliminary result)   Collection Time: 01/22/19  5:20 PM   Specimen: BLOOD LEFT HAND  Result Value Ref Range Status   Specimen Description   Final    BLOOD LEFT HAND Performed at [facility], [address] [place], [place] [address]    Special Requests   Final    BOTTLES DRAWN AEROBIC ONLY Blood Culture adequate volume Performed at [facility], [address] [place], [place] [address]    Culture   Final  NO GROWTH 2 DAYS Performed at [facility], [address], [place], [place] [address]    Report Status PENDING  Incomplete   Time spent: 30 min  SIGNED:   Rickey Barbara, MD  [facility] 01/25/2019, 1:12 PM  If 7PM-7AM, please contact night-coverage

## 2019-01-25 NOTE — Discharge Instructions (Signed)
Take antibiotics as prescribed Consider adding a probiotic to get good gut bacteria back  Continue low fiber diet until you are off antibiotics, abdominal pain completely gone, and bowels back to normal After this recommend a high fiber diet  Always drink plenty of water You will need to have a colonoscopy in 6-8 weeks   Low-Fiber Eating Plan Fiber is found in fruits, vegetables, whole grains, and beans. Eating a diet low in fiber helps to reduce how often you have bowel movements and how much you produce during a bowel movement. A low-fiber eating plan may help your digestive system heal if:  You have certain conditions, such as Crohn's disease or diverticulitis.  You recently had radiation therapy on your pelvis or bowel.  You recently had intestinal surgery.  You have a new surgical opening in your abdomen (colostomy or ileostomy).  Your intestine is narrowed (stricture). Your health care provider will determine how long you need to stay on this diet. Your health care provider may recommend that you work with a diet and nutrition specialist (dietitian). What are tips for following this plan? General guidelines  Follow recommendations from your dietitian about how much fiber you should have each day.  Most people on this eating plan should try to eat less than 10 grams (g) of fiber each day. Your daily fiber goal is _________________ g.  Take vitamin and mineral supplements as told by your health care provider or dietitian. Chewable or liquid forms are best when on this eating plan. Reading food labels  Check food labels for the amount of dietary fiber.  Choose foods that have less than 2 grams of fiber in one serving. Cooking  Use white flour and other allowed grains for baking and cooking.  Cook meat using methods that keep it tender, such as braising or poaching.  Cook eggs until the yolk is completely solid.  Cook with healthy oils, such as olive oil or canola  oil. Meal planning   Eat 5-6 small meals throughout the day instead of 3 large meals.  If you are lactose intolerant: ? Choose low-lactose dairy foods. ? Do not eat dairy foods, if told by your dietitian.  Limit fat and oils to less than 8 teaspoons a day.  Eat small portions of desserts. What foods are allowed? The items listed below may not be a complete list. Talk with your dietitian about what dietary choices are best for you. Grains All bread and crackers made with white flour. Waffles, pancakes, and [place] toast. Bagels. Pretzels. Melba toast, zwieback, and matzoh. Cooked and dried cereals that do not contain whole grains, added fiber, seeds, or dried fruit. CornmealDenzil Magnuson. Farina. Hot and cold cereals made with refined corn, wheat, rice, or oats. Plain pasta and noodles. White rice. Vegetables Well-cooked or canned vegetables without skin, seeds, or stems. Cooked potatoes without skins. Vegetable juice. Fruits Soft-cooked or canned fruits without skin and seeds. Peeled ripe banana. Applesauce. Fruit juice without pulp. Meats and other protein foods Ground meat. Tender cuts of meat or poultry. Eggs. Fish, seafood, and shellfish. Smooth nut butters. Tofu. Dairy All milk products and drinks. Lactose-free milks, including rice, soy, and almond milks. Yogurt without fruit, nuts, chocolate, or granola mix-ins. Sour cream. Cottage cheese. Cheese. Beverages Decaf coffee. Fruit and vegetable juices or smoothies (in small amounts, with no pulp or skins, and with fruits from allowed list). Sports drinks. Herbal tea. Fats and oils Olive oil, canola oil, sunflower oil, flaxseed oil, and grapeseed oil. Mayonnaise.  Cream cheese. Margarine. Butter. Sweets and desserts Plain cakes and cookies. Cream pies and pies made with allowed fruits. Pudding. Custard. Fruit gelatin. Sherbet. Popsicles. Ice cream without nuts. Plain hard candy. Honey. Jelly. Molasses. Syrups, including chocolate syrup. Chocolate.  Marshmallows. Gumdrops. Seasoning and other foods Bouillon. Broth. Cream soups made from allowed foods. Strained soup. Casseroles made with allowed foods. Ketchup. Mild mustard. Mild salad dressings. Plain gravies. Vinegar. Spices in moderation. Salt. Sugar. What foods are not allowed? The items listed below may not be a complete list. Talk with your dietitian about what dietary choices are best for you. Grains Whole wheat and whole grain breads and crackers. Multigrain breads and crackers. Rye bread. Whole grain or multigrain cereals. Cereals with nuts, raisins, or coconut. Bran. Coarse wheat cereals. Granola. High-fiber cereals. Cornmeal or corn bread. Whole grain pasta. Wild or brown rice. Quinoa. Popcorn. Buckwheat. Wheat germ. Vegetables Potato skins. Raw or undercooked vegetables. All beans and bean sprouts. Cooked greens. Corn. Peas. Cabbage. Beets. Broccoli. Brussels sprouts. Cauliflower. Mushrooms. Onions. Peppers. Parsnips. Okra. Sauerkraut. Fruit Raw or dried fruit. Berries. Fruit juice with pulp. Prune juice. Meats and other protein foods Tough, fibrous meats with gristle. Fatty meat. Poultry with skin. Fried meat, Environmental education officerpoultry, or fish. Deli or lunch meats. Sausage, bacon, and hot dogs. Nuts and chunky nut butter. Dried peas, beans, and lentils. Dairy Yogurt with fruit, nuts, chocolate, or granola mix-ins. Beverages Caffeinated coffee and teas. Fats and oils Avocado. Coconut. Sweets and desserts Desserts, cookies, or candies that contain nuts or coconut. Dried fruit. Jams and preserves with seeds. Marmalade. Any dessert made with fruits or grains that are not allowed. Seasoning and other foods Corn tortilla chips. Soups made with vegetables or grains that are not allowed. Relish. Horseradish. Rosita FirePickles. Olives. Summary  Most people on a low-fiber eating plan should eat less than 10 grams of fiber a day. Follow recommendations from your dietitian about how much fiber you should have  each day.  Always check food labels to see the dietary fiber content of packaged foods. In general, a low-fiber food will have fewer than 2 grams of fiber per serving.  In general, try to avoid whole grains, raw fruits and vegetables, dried fruit, tough cuts of meat, nuts, and seeds.  Take a vitamin and mineral supplement as told by your health care provider or dietitian. This information is not intended to replace advice given to you by your health care provider. Make sure you discuss any questions you have with your health care provider. Document Released: 12/19/2001 Document Revised: 10/21/2018 Document Reviewed: 09/01/2016 Elsevier Patient Education  2020 Elsevier Inc.    High-Fiber Diet Fiber, also called dietary fiber, is a type of carbohydrate that is found in fruits, vegetables, whole grains, and beans. A high-fiber diet can have many health benefits. Your health care provider may recommend a high-fiber diet to help:  Prevent constipation. Fiber can make your bowel movements more regular.  Lower your cholesterol.  Relieve the following conditions: ? Swelling of veins in the anus (hemorrhoids). ? Swelling and irritation (inflammation) of specific areas of the digestive tract (uncomplicated diverticulosis). ? A problem of the large intestine (colon) that sometimes causes pain and diarrhea (irritable bowel syndrome, IBS).  Prevent overeating as part of a weight-loss plan.  Prevent heart disease, type 2 diabetes, and certain cancers. What is my plan? The recommended daily fiber intake in grams (g) includes:  38 g for men age 48 or younger.  30 g for men over age 48.  25 g for women age 48 or younger.  21 g for women over age 48. You can get the recommended daily intake of dietary fiber by:  Eating a variety of fruits, vegetables, grains, and beans.  Taking a fiber supplement, if it is not possible to get enough fiber through your diet. What do I need to know about a  high-fiber diet?  It is better to get fiber through food sources rather than from fiber supplements. There is not a lot of research about how effective supplements are.  Always check the fiber content on the nutrition facts label of any prepackaged food. Look for foods that contain 5 g of fiber or more per serving.  Talk with a diet and nutrition specialist (dietitian) if you have questions about specific foods that are recommended or not recommended for your medical condition, especially if those foods are not listed below.  Gradually increase how much fiber you consume. If you increase your intake of dietary fiber too quickly, you may have bloating, cramping, or gas.  Drink plenty of water. Water helps you to digest fiber. What are tips for following this plan?  Eat a wide variety of high-fiber foods.  Make sure that half of the grains that you eat each day are whole grains.  Eat breads and cereals that are made with whole-grain flour instead of refined flour or white flour.  Eat brown rice, bulgur wheat, or millet instead of white rice.  Start the day with a breakfast that is high in fiber, such as a cereal that contains 5 g of fiber or more per serving.  Use beans in place of meat in soups, salads, and pasta dishes.  Eat high-fiber snacks, such as berries, raw vegetables, nuts, and popcorn.  Choose whole fruits and vegetables instead of processed forms like juice or sauce. What foods can I eat?  Fruits Berries. Pears. Apples. Oranges. Avocado. Prunes and raisins. Dried figs. Vegetables Sweet potatoes. Spinach. Kale. Artichokes. Cabbage. Broccoli. Cauliflower. Green peas. Carrots. Squash. Grains Whole-grain breads. Multigrain cereal. Oats and oatmeal. Brown rice. Barley. Bulgur wheat. Millet. Quinoa. Bran muffins. Popcorn. Rye wafer crackers. Meats and other proteins Navy, kidney, and pinto beans. Soybeans. Split peas. Lentils. Nuts and seeds. Dairy Fiber-fortified  yogurt. Beverages Fiber-fortified soy milk. Fiber-fortified orange juice. Other foods Fiber bars. The items listed above may not be a complete list of recommended foods and beverages. Contact a dietitian for more options. What foods are not recommended? Fruits Fruit juice. Cooked, strained fruit. Vegetables Fried potatoes. Canned vegetables. Well-cooked vegetables. Grains White bread. Pasta made with refined flour. White rice. Meats and other proteins Fatty cuts of meat. Fried chicken or fried fish. Dairy Milk. Yogurt. Cream cheese. Sour cream. Fats and oils Butters. Beverages Soft drinks. Other foods Cakes and pastries. The items listed above may not be a complete list of foods and beverages to avoid. Contact a dietitian for more information. Summary  Fiber is a type of carbohydrate. It is found in fruits, vegetables, whole grains, and beans.  There are many health benefits of eating a high-fiber diet, such as preventing constipation, lowering blood cholesterol, helping with weight loss, and reducing your risk of heart disease, diabetes, and certain cancers.  Gradually increase your intake of fiber. Increasing too fast can result in cramping, bloating, and gas. Drink plenty of water while you increase your fiber.  The best sources of fiber include whole fruits and vegetables, whole grains, nuts, seeds, and beans. This information is not intended  to replace advice given to you by your health care provider. Make sure you discuss any questions you have with your health care provider. Document Released: 06/29/2005 Document Revised: 05/03/2017 Document Reviewed: 05/03/2017 Elsevier Patient Education  2020 [employer].

## 2019-01-25 NOTE — Progress Notes (Signed)
Discharge instructions given to pt and all questions were answered.  

## 2019-01-25 NOTE — Progress Notes (Addendum)
[facility] Progress Note     Subjective: CC-  Feeling better each day. Denies abdominal pain, nausea, vomiting. Tolerating full liquids. States that he had a formed BM this morning. WBC 6.1, VSS.  Objective: Vital signs in last 24 hours: Temp:  [98 F (36.7 C)-98.6 F (37 C)] 98 F (36.7 C) (07/15 0503) Pulse Rate:  [61-80] 61 (07/15 0503) Resp:  [15-18] 15 (07/15 0503) BP: (100-128)/(78-94) 100/78 (07/15 0503) SpO2:  [97 %-100 %] 97 % (07/15 0503) Last BM Date: 01/24/19  Intake/Output from previous day: 07/14 0701 - 07/15 0700 In: 3302.3 [P.O.:840; I.V.:2387.2; IV Piggyback:75.1] Out: 1950 [KZSWF:[id number]] Intake/Output this shift: No intake/output data recorded.  PE: Gen:  Alert, NAD, pleasant HEENT: EOM's intact, pupils equal and round Pulm:  Rate and effort normal Abd: soft, ND, +BS, no masses or organomegaly, abdomen nontender Psych: A&Ox3  Skin: no rashes noted, warm and dry   Lab Results:  Recent Labs    01/24/19 0354 01/25/19 0345  WBC 5.7 6.1  HGB 14.7 12.4*  HCT 45.1 37.8*  PLT 190 208   BMET Recent Labs    01/24/19 0354 01/25/19 0345  NA 141 140  K 4.5 4.0  CL 106 106  CO2 24 26  GLUCOSE 81 80  BUN 8 8  CREATININE 0.89 0.95  CALCIUM 9.0 8.7*   PT/INR No results for input(s): LABPROT, INR in the last 72 hours. CMP     Component Value Date/Time   NA 140 01/25/2019 0345   K 4.0 01/25/2019 0345   CL 106 01/25/2019 0345   CO2 26 01/25/2019 0345   GLUCOSE 80 01/25/2019 0345   BUN 8 01/25/2019 0345   CREATININE 0.95 01/25/2019 0345   CREATININE 0.88 12/27/2018 1609   CALCIUM 8.7 (L) 01/25/2019 0345   PROT 6.5 01/23/2019 0406   ALBUMIN 3.3 (L) 01/25/2019 0345   AST 18 01/23/2019 0406   ALT 23 01/23/2019 0406   ALKPHOS 72 01/23/2019 0406   BILITOT 0.9 01/23/2019 0406   GFRNONAA >60 01/25/2019 0345   GFRNONAA 102 12/27/2018 1609   GFRAA >60 01/25/2019 0345   GFRAA 118 12/27/2018 1609   Lipase     Component Value  Date/Time   LIPASE 20 01/22/2019 1100       Studies/Results: No results found.  Anti-infectives: Anti-infectives (From admission, onward)   Start     Dose/Rate Route Frequency Ordered Stop   01/25/19 1000  amoxicillin-clavulanate (AUGMENTIN) 875-125 MG per tablet 1 tablet     1 tablet Oral Every 12 hours 01/25/19 0841     01/22/19 2000  piperacillin-tazobactam (ZOSYN) IVPB 3.375 g  Status:  Discontinued     3.375 g 12.5 mL/hr over 240 Minutes Intravenous Every 8 hours 01/22/19 1655 01/25/19 0841   01/22/19 1300  piperacillin-tazobactam (ZOSYN) IVPB 3.375 g     3.375 g 12.5 mL/hr over 240 Minutes Intravenous  Once 01/22/19 1224 01/22/19 1320       Assessment/Plan HLD  Chronic hematuria  Sigmoid diverticulitis with microperforation - 1st bout of diverticulitis - never had a colonoscopy before - if this resolves medically we will plan for a colonoscopy in 6-8 weeks  ID -zosyn 7/12>>7/15, augmentin 7/15>> VTE -SCDs, lovenox FEN - decreaseIVF, soft diet Foley -none Follow up -TBD  Plan: Advance to soft diet and transition to oral antibiotics. If patient is tolerating this he may be discharged today from surgical standpoint.   Recommend f/u with PCP (Dr. Orlinda Blalock)  in 2-3 weeks, and colonoscopy  in 6-8 weeks. Discussed surgical follow up and since this is his first bout and he responded well to medical management he will follow up with surgery PRN.   LOS: 3 days    Franne FortsBrooke A Meuth , [facility] 01/25/2019, 8:44 AM Pager: [phone]  Agree with above. Since this is his first bout of diverticulitis and he has responded well to antibiotcs and bowel rest, he does not need follow up with surgery.  Jason Curry Iola Turri, MD, [facility] Pager: [phone] Office phone:  [phone]

## 2019-01-27 LAB — CULTURE, BLOOD (ROUTINE X 2)
Culture: NO GROWTH
Culture: NO GROWTH
Special Requests: ADEQUATE
Special Requests: ADEQUATE

## 2019-02-15 NOTE — Progress Notes (Signed)
   Subjective:    Patient ID: Jason Curry, male    DOB: [birth year], 48 y.o.   MRN: [id number]  HPI    Patient isa very nice 48 yo MWM hospitalized 07/12-07/15/2020 with Diverticulitis. CT scan sugggested microperforation. He was treated with IV Zosyn & d/c'd on Augmentin for 11 more days.Covid-19 test was NEGATIVE in hospital. He was followed conservatively by Dr Alphonsa Overall. He was recommended to have a colonoscopy in 6-8 weeks post hospital. Since hospital , he has completed his Augmentin about 2 weeks ago and he reports having some recurrent LLQ discomfort.   Medication Sig  . VITA-C Take 1 tablet by mouth daily.   Marland Kitchen aspirin 81 MG tablet Take 81 mg by mouth at bedtime.   Marland Kitchen VITAMIN D 2000 units CAPS Take 2 capsules by mouth daily.  Marland Kitchen VITAMIN B-12 SL Place 1 tablet under the tongue at bedtime.   Marland Kitchen ibuprofen  200 MG tablet Take 400 mg by mouth daily as needed for headache.  . Magnesium 200 MG TABS Take 1 tablet by mouth at bedtime.   . rosuvastatin 40 MG tablet Takes 1 tablet daily for cholesterol   . traMADol  50 MG tablet Take 1 tablet every 6 hours as needed for moderate pain.  Marland Kitchen zinc gluconate 50 MG tablet Take 50 mg by mouth at bedtime.   No Known Allergies   Past Medical History:  Diagnosis Date  . Hematuria   . Hyperlipidemia   . Hypertension   . Hypogonadism male    No past surgical history on file.  Review of Systems   10 point systems review negative except as above.    Objective:   Physical Exam  BP 122/84   Pulse 84   Temp (!) 97.2 F (36.2 C)   Resp 16   Ht 5' 6.5" (1.689 m)   Wt 203 lb 9.6 oz (92.4 kg)   BMI 32.37 kg/m   HEENT - WNL. Neck - supple.  Chest - Clear equal BS. Cor - Nl HS. RRR w/o sig MGR. PP 1(+). No edema. Abd - Soft with sl LLQ tenderness w/o guarding or rebound & BS Nl.  MS- FROM w/o deformities.  Gait Nl. Neuro -  Nl w/o focal abnormalities.    Assessment & Plan:   1. Acute diverticulitis  - CBC with Differential/Platelet -  COMPLETE METABOLIC PANEL WITH GFR  - ciprofloxacin 500 MG; Take 1 tablet 2 x /day with Meals for Diverticulitis  Disp: 20; Rf: 1 - metroNIDAZOLE  500 MG; Take 1 tablet 3 x /day with meals for Diverticulitis  Dis: 30 tablet; Rf: 1  2. Medication management  - CBC with Differential/Platelet - COMPLETE METABOLIC PANEL WITH GFR  - discussed diet, meds, SE and Sx return precautions.

## 2019-02-15 NOTE — Patient Instructions (Signed)
Diverticulitis  Diverticulitis is infection or inflammation of small pouches (diverticula) in the colon that form due to a condition called diverticulosis. Diverticula can trap stool (feces) and bacteria, causing infection and inflammation. Diverticulitis may cause severe stomach pain and diarrhea. It may lead to tissue damage in the colon that causes bleeding. The diverticula may also burst (rupture) and cause infected stool to enter other areas of the abdomen. Complications of diverticulitis can include:  Bleeding.  Severe infection.  Severe pain.  Rupture (perforation) of the colon.  Blockage (obstruction) of the colon. What are the causes? This condition is caused by stool becoming trapped in the diverticula, which allows bacteria to grow in the diverticula. This leads to inflammation and infection. What increases the risk? You are more likely to develop this condition if:  You have diverticulosis. The risk for diverticulosis increases if: ? You are overweight or obese. ? You use tobacco products. ? You do not get enough exercise.  You eat a diet that does not include enough fiber. High-fiber foods include fruits, vegetables, beans, nuts, and whole grains. What are the signs or symptoms? Symptoms of this condition may include:  Pain and tenderness in the abdomen. The pain is normally located on the left side of the abdomen, but it may occur in other areas.  Fever and chills.  Bloating.  Cramping.  Nausea.  Vomiting.  Changes in bowel routines.  Blood in your stool. How is this diagnosed? This condition is diagnosed based on:  Your medical history.  A physical exam.  Tests to make sure there is nothing else causing your condition. These tests may include: ? Blood tests. ? Urine tests. ? Imaging tests of the abdomen, including X-rays, ultrasounds, MRIs, or CT scans. How is this treated? Most cases of this condition are mild and can be treated at home.  Treatment may include:  Taking over-the-counter pain medicines.  Following a clear liquid diet.  Taking antibiotic medicines by mouth.  Rest. More severe cases may need to be treated at a hospital. Treatment may include:  Not eating or drinking.  Taking prescription pain medicine.  Receiving antibiotic medicines through an IV tube.  Receiving fluids and nutrition through an IV tube.  Surgery. When your condition is under control, your health care provider may recommend that you have a colonoscopy. This is an exam to look at the entire large intestine. During the exam, a lubricated, bendable tube is inserted into the anus and then passed into the rectum, colon, and other parts of the large intestine. A colonoscopy can show how severe your diverticula are and whether something else may be causing your symptoms. Follow these instructions at home: Medicines  Take over-the-counter and prescription medicines only as told by your health care provider. These include fiber supplements, probiotics, and stool softeners.  If you were prescribed an antibiotic medicine, take it as told by your health care provider. Do not stop taking the antibiotic even if you start to feel better.  Do not drive or use heavy machinery while taking prescription pain medicine. General instructions   Follow a full liquid diet or another diet as directed by your health care provider. After your symptoms improve, your health care provider may tell you to change your diet. He or she may recommend that you eat a diet that contains at least 25 g (25 grams) of fiber daily. Fiber makes it easier to pass stool. Healthy sources of fiber include: ? Berries. One cup contains 4-8 grams of   fiber. ? Beans or lentils. One half cup contains 5-8 grams of fiber. ? Green vegetables. One cup contains 4 grams of fiber.  Exercise for at least 30 minutes, 3 times each week. You should exercise hard enough to raise your heart rate and  break a sweat.  Keep all follow-up visits as told by your health care provider. This is important. You may need a colonoscopy. Contact a health care provider if:  Your pain does not improve.  You have a hard time drinking or eating food.  Your bowel movements do not return to normal. Get help right away if:  Your pain gets worse.  Your symptoms do not get better with treatment.  Your symptoms suddenly get worse.  You have a fever.  You vomit more than one time.  You have stools that are bloody, black, or tarry. Summary  Diverticulitis is infection or inflammation of small pouches (diverticula) in the colon that form due to a condition called diverticulosis. Diverticula can trap stool (feces) and bacteria, causing infection and inflammation.  You are at higher risk for this condition if you have diverticulosis and you eat a diet that does not include enough fiber.  Most cases of this condition are mild and can be treated at home. More severe cases may need to be treated at a hospital.  When your condition is under control, your health care provider may recommend that you have an exam called a colonoscopy. This exam can show how severe your diverticula are and whether something else may be causing your symptoms. This information is not intended to replace advice given to you by your health care provider. Make sure you discuss any questions you have with your health care provider. Document Released: 04/08/2005 Document Revised: 06/11/2017 Document Reviewed: 08/01/2016 Elsevier Patient Education  2020 [employer] Inc[employer]  

## 2019-02-16 ENCOUNTER — Ambulatory Visit (INDEPENDENT_AMBULATORY_CARE_PROVIDER_SITE_OTHER): Payer: BC Managed Care – PPO | Admitting: Internal Medicine

## 2019-02-16 ENCOUNTER — Other Ambulatory Visit: Payer: Self-pay

## 2019-02-16 VITALS — BP 122/84 | HR 84 | Temp 97.2°F | Resp 16 | Ht 66.5 in | Wt 203.6 lb

## 2019-02-16 DIAGNOSIS — Z79899 Other long term (current) drug therapy: Secondary | ICD-10-CM

## 2019-02-16 DIAGNOSIS — K5792 Diverticulitis of intestine, part unspecified, without perforation or abscess without bleeding: Secondary | ICD-10-CM

## 2019-02-16 MED ORDER — METRONIDAZOLE 500 MG PO TABS: ORAL_TABLET | ORAL | 1 refills | Status: DC

## 2019-02-16 MED ORDER — CIPROFLOXACIN HCL 500 MG PO TABS: ORAL_TABLET | ORAL | 1 refills | Status: DC

## 2019-02-17 ENCOUNTER — Encounter: Payer: Self-pay | Admitting: Internal Medicine

## 2019-02-17 LAB — CBC WITH DIFFERENTIAL/PLATELET
Absolute Monocytes: 583 cells/uL (ref 200–950)
Basophils Absolute: 20 cells/uL (ref 0–200)
Basophils Relative: 0.3 %
Eosinophils Absolute: 107 cells/uL (ref 15–500)
Eosinophils Relative: 1.6 %
HCT: 42.1 % (ref 38.5–50.0)
Hemoglobin: 14.4 g/dL (ref 13.2–17.1)
Lymphs Abs: 1635 cells/uL (ref 850–3900)
MCH: 33 pg (ref 27.0–33.0)
MCHC: 34.2 g/dL (ref 32.0–36.0)
MCV: 96.6 fL (ref 80.0–100.0)
MPV: 11.2 fL (ref 7.5–12.5)
Monocytes Relative: 8.7 %
Neutro Abs: 4355 cells/uL (ref 1500–7800)
Neutrophils Relative %: 65 %
Platelets: 220 10*3/uL (ref 140–400)
RBC: 4.36 10*6/uL (ref 4.20–5.80)
RDW: 13.6 % (ref 11.0–15.0)
Total Lymphocyte: 24.4 %
WBC: 6.7 10*3/uL (ref 3.8–10.8)

## 2019-02-17 LAB — COMPLETE METABOLIC PANEL WITH GFR
AG Ratio: 1.9 (calc) (ref 1.0–2.5)
ALT: 28 U/L (ref 9–46)
AST: 23 U/L (ref 10–40)
Albumin: 4.8 g/dL (ref 3.6–5.1)
Alkaline phosphatase (APISO): 112 U/L (ref 36–130)
BUN: 11 mg/dL (ref 7–25)
CO2: 27 mmol/L (ref 20–32)
Calcium: 9.5 mg/dL (ref 8.6–10.3)
Chloride: 106 mmol/L (ref 98–110)
Creat: 0.83 mg/dL (ref 0.60–1.35)
GFR, Est African American: 121 mL/min/{1.73_m2} (ref >=60)
GFR, Est Non African American: 104 mL/min/{1.73_m2} (ref >=60)
Globulin: 2.5 g/dL (calc) (ref 1.9–3.7)
Glucose, Bld: 83 mg/dL (ref 65–99)
Potassium: 4.7 mmol/L (ref 3.5–5.3)
Sodium: 143 mmol/L (ref 135–146)
Total Bilirubin: 0.6 mg/dL (ref 0.2–1.2)
Total Protein: 7.3 g/dL (ref 6.1–8.1)

## 2019-03-06 ENCOUNTER — Other Ambulatory Visit: Payer: Self-pay

## 2019-03-06 ENCOUNTER — Ambulatory Visit: Payer: BC Managed Care – PPO | Admitting: Internal Medicine

## 2019-03-06 ENCOUNTER — Encounter: Payer: Self-pay | Admitting: Internal Medicine

## 2019-03-06 VITALS — BP 126/96 | HR 88 | Temp 97.1°F | Resp 16 | Ht 66.5 in | Wt 200.4 lb

## 2019-03-06 DIAGNOSIS — K644 Residual hemorrhoidal skin tags: Secondary | ICD-10-CM

## 2019-03-06 MED ORDER — PREDNISONE 20 MG PO TABS: ORAL_TABLET | ORAL | 0 refills | Status: DC

## 2019-03-06 MED ORDER — HYDROCORTISONE (PERIANAL) 2.5 % EX CREA: 1.0000 "application " | TOPICAL_CREAM | Freq: Two times a day (BID) | CUTANEOUS | 1 refills | Status: DC

## 2019-03-06 NOTE — Progress Notes (Signed)
   Subjective:    Patient ID: Jason Curry, male    DOB: 19-Dec-1970, 48 y.o.   MRN: [id number]  HPI   Patient is a nice 48 yo MWM presenting with a 3-4 day hx/o hemorrhoidal itching & pain . Using Prep-H w/o relief.  Medication Sig  . Ascorbic Acid (VITA-C PO) Take 1 tablet by mouth daily.   Marland Kitchen aspirin 81 MG tablet Take 81 mg by mouth at bedtime.   . Cholecalciferol (VITAMIN D) 2000 units CAPS Take 2 capsules by mouth daily.  . Cyanocobalamin (VITAMIN B-12 SL) Place 1 tablet under the tongue at bedtime.   Marland Kitchen ibuprofen (ADVIL) 200 MG tablet Take 400 mg by mouth daily as needed for headache.  . Magnesium 200 MG TABS Take 1 tablet by mouth at bedtime.   . rosuvastatin (CRESTOR) 40 MG tablet Takes 1/2 to 1 tablet daily for cholesterol (Patient taking differently: Take 40 mg by mouth at bedtime. )  . zinc gluconate 50 MG tablet Take 50 mg by mouth at bedtime.  . ciprofloxacin (CIPRO) 500 MG tablet Take 1 tablet 2 x /day with Meals for Diverticulitis  . metroNIDAZOLE (FLAGYL) 500 MG tablet Take 1 tablet 3 x /day with meals for Diverticulitis  . traMADol (ULTRAM) 50 MG tablet Take 1 tablet (50 mg total) by mouth every 6 (six) hours as needed for moderate pain. (Patient not taking: Reported on 02/16/2019)   No facility-administered medications prior to visit.    No Known Allergies Past Medical History:  Diagnosis Date  . Hematuria   . Hyperlipidemia   . Hypertension   . Hypogonadism male    Review of Systems   10 point systems review negative except as above.    Objective:   Physical Exam  BP (!) 126/96   Pulse 88   Temp (!) 97.1 F (36.2 C)   Resp 16   Ht 5' 6.5" (1.689 m)   Wt 200 lb 6.4 oz (90.9 kg)   BMI 31.86 kg/m   Focused exam find a moderate external hemorrhoid spanning 8-12 oclock and appears inflamed.    Assessment & Plan:   1. External hemorrhoid  - predniSONE (DELTASONE) 20 MG tablet; 1 tab 3 x day for 3 days, then 1 tab 2 x day for 3 days, then 1 tab 1 x day for 5  days  Dispense: 20 tablet  - hydrocortisone (ANUSOL-HC) 2.5 % rectal cream; Place 1 application rectally 2 (two) times daily. Apply to Hemorrhoid 3 to 4 x /da  Dispense: 90 g; Refill: 1  -  Recc Sitz baths 3-4 x /day   -  Recc stool softener  - Discussed meds / SE's

## 2019-03-06 NOTE — Patient Instructions (Signed)

## 2019-03-29 NOTE — Progress Notes (Signed)
FOLLOW UP  Assessment and Plan:   Hypertension Well controlled at this time without medication  Monitor blood pressure at home; patient to call if consistently greater than 130/80 Continue DASH diet.   Reminder to go to the ER if any CP, SOB, nausea, dizziness, severe HA, changes vision/speech, left arm numbness and tingling and jaw pain.  Cholesterol Currently above goal - was increased to 40 mg atorvastatin daily at the last visit Continue low cholesterol diet and exercise.  Check lipid panel.   Obesity with co morbidities Long discussion about weight loss, diet, and exercise - he is currently demonstrating successful gradual weight loss by lifestyle modification Recommended diet heavy in fruits and veggies and low in animal meats, cheeses, and dairy products, appropriate calorie intake Cut back on ETOH use Discussed ideal weight for height Patient will work on continue current good diet and avoidance of alcohol  Will follow up in 3 months  Vitamin D Def Below goal at last visit but has not changed dose; advised to double dose to 4000 U daily continue supplementation to maintain goal of 70-100  Vitamin B12 deficiency -     Vitamin B12   Continue diet and meds as discussed. Further disposition pending results of labs. Discussed med's effects and SE's.   Over 30 minutes of exam, counseling, chart review, and critical decision making was performed.   Future Appointments  Date Time Provider Bacon  07/11/2019  2:00 PM Unk Pinto, MD [facility] None    ----------------------------------------------------------------------------------------------------------------------  HPI 48 y.o. male  presents for 3 month follow up on hypertension, cholesterol, glucose management, obesity and vitamin D deficiency.   BMI is Body mass index is 31.96 kg/m., he has been working on diet and has cut out alcohol (2-4 beers) - he does not exercise. He does work a physically  intense job in [employer].  Wt Readings from Last 3 Encounters:  03/30/19 201 lb (91.2 kg)  03/06/19 200 lb 6.4 oz (90.9 kg)  02/16/19 203 lb 9.6 oz (92.4 kg)   Today their BP is BP: 120/86  He does not workout. He denies chest pain, shortness of breath, dizziness.   He is on cholesterol medication - he is on 20mg  every day, and denies myalgias. His cholesterol is not at goal. The cholesterol last visit was:   Lab Results  Component Value Date   CHOL 240 (H) 12/27/2018   HDL 51 12/27/2018   LDLCALC 157 (H) 12/27/2018   TRIG 183 (H) 12/27/2018   CHOLHDL 4.7 12/27/2018    He has been working on diet and exercise for glucose management, and denies increased appetite, nausea, paresthesia of the feet, polydipsia, polyuria, visual disturbances and vomiting. Last A1C in the office was:  Lab Results  Component Value Date   HGBA1C 4.9 12/27/2018   Patient is on Vitamin D supplement but well below goal of 70 at the last check:    Lab Results  Component Value Date   VD25OH 39 12/27/2018        Current Medications:  Current Outpatient Medications on File Prior to Visit  Medication Sig  . Ascorbic Acid (VITA-C PO) Take 1 tablet by mouth daily.   Marland Kitchen aspirin 81 MG tablet Take 81 mg by mouth at bedtime.   . Cholecalciferol (VITAMIN D) 2000 units CAPS Take 2 capsules by mouth daily.  . Cyanocobalamin (VITAMIN B-12 SL) Place 1 tablet under the tongue at bedtime.   Marland Kitchen ibuprofen (ADVIL) 200 MG tablet Take 400 mg by  mouth daily as needed for headache.  . Magnesium 200 MG TABS Take 1 tablet by mouth at bedtime.   . rosuvastatin (CRESTOR) 40 MG tablet Takes 1/2 to 1 tablet daily for cholesterol (Patient taking differently: Take 40 mg by mouth at bedtime. )  . zinc gluconate 50 MG tablet Take 50 mg by mouth at bedtime.   No current facility-administered medications on file prior to visit.      Allergies: No Known Allergies   Medical History:  Past Medical History:  Diagnosis Date  .  Hematuria   . Hyperlipidemia   . Hypertension   . Hypogonadism male    Family history- Reviewed and unchanged Social history- Reviewed and unchanged   Review of Systems:  Review of Systems  Constitutional: Negative for malaise/fatigue and weight loss.  HENT: Negative for hearing loss and tinnitus.   Eyes: Negative for blurred vision and double vision.  Respiratory: Negative for cough, shortness of breath and wheezing.   Cardiovascular: Negative for chest pain, palpitations, orthopnea, claudication and leg swelling.  Gastrointestinal: Negative for abdominal pain, blood in stool, constipation, diarrhea, heartburn, melena, nausea and vomiting.  Genitourinary: Negative.   Musculoskeletal: Negative for joint pain and myalgias.  Skin: Negative for rash.  Neurological: Negative for dizziness, tingling, sensory change, weakness and headaches.  Endo/Heme/Allergies: Negative for polydipsia.  Psychiatric/Behavioral: Negative.   All other systems reviewed and are negative.     Physical Exam: BP 120/86   Pulse 79   Temp 97.7 F (36.5 C)   Wt 201 lb (91.2 kg)   SpO2 99%   BMI 31.96 kg/m  Wt Readings from Last 3 Encounters:  03/30/19 201 lb (91.2 kg)  03/06/19 200 lb 6.4 oz (90.9 kg)  02/16/19 203 lb 9.6 oz (92.4 kg)   General Appearance: Well nourished, in no apparent distress. Eyes: PERRLA, EOMs, conjunctiva no swelling or erythema Sinuses: No Frontal/maxillary tenderness ENT/Mouth: Ext aud canals clear, TMs without erythema, bulging. No erythema, swelling, or exudate on post pharynx.  Tonsils not swollen or erythematous. Hearing normal.  Neck: Supple, thyroid normal.  Respiratory: Respiratory effort normal, BS equal bilaterally without rales, rhonchi, wheezing or stridor.  Cardio: RRR with no MRGs. Brisk peripheral pulses without edema.  Abdomen: Soft, + BS.  Non tender, no guarding, rebound, hernias, masses. Lymphatics: Non tender without lymphadenopathy.  Musculoskeletal:  Full ROM, 5/5 strength, Normal gait Skin: Warm, dry without rashes, lesions, ecchymosis.  Neuro: Cranial nerves intact. No cerebellar symptoms.  Psych: Awake and oriented X 3, normal affect, Insight and Judgment appropriate.    Quentin MullingAmanda Collier, PA-C 4:21 PM [facility]

## 2019-03-30 ENCOUNTER — Encounter: Payer: Self-pay | Admitting: Physician Assistant

## 2019-03-30 ENCOUNTER — Ambulatory Visit: Payer: BC Managed Care – PPO | Admitting: Physician Assistant

## 2019-03-30 ENCOUNTER — Other Ambulatory Visit: Payer: Self-pay

## 2019-03-30 VITALS — BP 120/86 | HR 79 | Temp 97.7°F | Wt 201.0 lb

## 2019-03-30 DIAGNOSIS — E538 Deficiency of other specified B group vitamins: Secondary | ICD-10-CM

## 2019-03-30 DIAGNOSIS — I10 Essential (primary) hypertension: Secondary | ICD-10-CM

## 2019-03-30 DIAGNOSIS — Z130 Encounter for screening for diseases of the blood and blood-forming organs and certain disorders involving the immune mechanism: Secondary | ICD-10-CM

## 2019-03-30 DIAGNOSIS — E349 Endocrine disorder, unspecified: Secondary | ICD-10-CM

## 2019-03-30 DIAGNOSIS — E782 Mixed hyperlipidemia: Secondary | ICD-10-CM

## 2019-03-30 DIAGNOSIS — E559 Vitamin D deficiency, unspecified: Secondary | ICD-10-CM | POA: Diagnosis not present

## 2019-03-30 DIAGNOSIS — Z79899 Other long term (current) drug therapy: Secondary | ICD-10-CM

## 2019-03-30 DIAGNOSIS — R7309 Other abnormal glucose: Secondary | ICD-10-CM | POA: Diagnosis not present

## 2019-03-30 NOTE — Patient Instructions (Signed)
Your LDL could improve, ideally we want it under a 100.  Your LDL is the bad cholesterol that can lead to heart attack and stroke. To lower your number you can decrease your fatty foods, red meat, cheese, milk and increase fiber like whole grains and veggies. You can also add a fiber supplement like Citracel or Benefiber, these do not cause gas and bloating and are safe to use. Especially if you have a strong family history of heart disease or stroke or you have evidence of plaque on any imaging like a chest xray, we may discuss at your next office visit putting you on a medication to get your number below 100.   I want to challenge you to lose 10 lbs before the next appointment.  That would be about 3 lbs a month!  This is very achievable and I know you can do it.  10 lbs is actually 40 lbs of pressure of your joints, back, hips, knees and ankles.  10lbs may be enough to help your blood pressure and cholesterol for next visit.   Vitamin B12 Deficiency Vitamin B12 deficiency occurs when the body does not have enough vitamin B12, which is an important vitamin. The body needs this vitamin:  To make red blood cells.  To make DNA. This is the genetic material inside cells.  To help the nerves work properly so they can carry messages from the brain to the body. Vitamin B12 deficiency can cause various health problems, such as a low red blood cell count (anemia) or nerve damage. What are the causes? This condition may be caused by:  Not eating enough foods that contain vitamin B12.  Not having enough stomach acid and digestive fluids to properly absorb vitamin B12 from the food that you eat.  Certain digestive system diseases that make it hard to absorb vitamin B12. These diseases include Crohn's disease, chronic pancreatitis, and cystic fibrosis.  A condition in which the body does not make enough of a protein (intrinsic factor), resulting in too few red blood cells (pernicious anemia).   Having a surgery in which part of the stomach or small intestine is removed.  Taking certain medicines that make it hard for the body to absorb vitamin B12. These medicines include: ? Heartburn medicines (antacids and proton pump inhibitors). ? Certain antibiotic medicines. ? Some medicines that are used to treat diabetes, tuberculosis, gout, or high cholesterol. What increases the risk? The following factors may make you more likely to develop a B12 deficiency:  Being older than age 27.  Eating a vegetarian or vegan diet, especially while you are pregnant.  Eating a poor diet while you are pregnant.  Taking certain medicines.  Having alcoholism. What are the signs or symptoms? In some cases, there are no symptoms of this condition. If the condition leads to anemia or nerve damage, various symptoms can occur, such as:  Weakness.  Fatigue.  Loss of appetite.  Weight loss.  Numbness or tingling in your hands and feet.  Redness and burning of the tongue.  Confusion or memory problems.  Depression.  Sensory problems, such as color blindness, ringing in the ears, or loss of taste.  Diarrhea or constipation.  Trouble walking. If anemia is severe, symptoms can include:  Shortness of breath.  Dizziness.  Rapid heart rate (tachycardia). How is this diagnosed? This condition may be diagnosed with a blood test to measure the level of vitamin B12 in your blood. You may also have other tests, including:  A group of tests that measure certain characteristics of blood cells (complete blood count, CBC).  A blood test to measure intrinsic factor.  A procedure where a thin tube with a camera on the end is used to look into your stomach or intestines (endoscopy). Other tests may be needed to discover the cause of B12 deficiency. How is this treated? Treatment for this condition depends on the cause. This condition may be treated by:  Changing your eating and drinking habits,  such as: ? Eating more foods that contain vitamin B12. ? Drinking less alcohol or no alcohol.  Getting vitamin B12 injections.  Taking vitamin B12 supplements. Your health care provider will tell you which dosage is best for you. Follow these instructions at home: Eating and drinking   Eat lots of healthy foods that contain vitamin B12, including: ? Meats and poultry. This includes beef, pork, chicken, [place], and organ meats, such as liver. ? Seafood. This includes clams, rainbow trout, salmon, tuna, and haddock. ? Eggs. ? Cereal and dairy products that are fortified. This means that vitamin B12 has been added to the food. Check the label on the package to see if the food is fortified. The items listed above may not be a complete list of recommended foods and beverages. Contact a dietitian for more information. General instructions  Get any injections that are prescribed by your health care provider.  Take supplements only as told by your health care provider. Follow the directions carefully.  Do not drink alcohol if your health care provider tells you not to. In some cases, you may only be asked to limit alcohol use.  Keep all follow-up visits as told by your health care provider. This is important. Contact a health care provider if:  Your symptoms come back. Get help right away if you:  Develop shortness of breath.  Have a rapid heart rate.  Have chest pain.  Become dizzy or lose consciousness. Summary  Vitamin B12 deficiency occurs when the body does not have enough vitamin B12.  The main causes of vitamin B12 deficiency include dietary deficiency, digestive diseases, pernicious anemia, and having a surgery in which part of the stomach or small intestine is removed.  In some cases, there are no symptoms of this condition. If the condition leads to anemia or nerve damage, various symptoms can occur, such as weakness, shortness of breath, and numbness.  Treatment may  include getting vitamin B12 injections or taking vitamin B12 supplements. Eat lots of healthy foods that contain vitamin B12. This information is not intended to replace advice given to you by your health care provider. Make sure you discuss any questions you have with your health care provider. Document Released: 09/21/2011 Document Revised: 03/08/2018 Document Reviewed: 03/08/2018 Elsevier Patient Education  2020 [employer]

## 2019-03-31 LAB — LIPID PANEL
Cholesterol: 171 mg/dL (ref <200)
HDL: 53 mg/dL (ref >=40)
LDL Cholesterol (Calc): 95 mg/dL (calc)
Non-HDL Cholesterol (Calc): 118 mg/dL (calc) (ref <130)
Total CHOL/HDL Ratio: 3.2 (calc) (ref <5.0)
Triglycerides: 132 mg/dL (ref <150)

## 2019-03-31 LAB — COMPLETE METABOLIC PANEL WITH GFR
AG Ratio: 2 (calc) (ref 1.0–2.5)
ALT: 39 U/L (ref 9–46)
AST: 25 U/L (ref 10–40)
Albumin: 5 g/dL (ref 3.6–5.1)
Alkaline phosphatase (APISO): 118 U/L (ref 36–130)
BUN: 15 mg/dL (ref 7–25)
CO2: 28 mmol/L (ref 20–32)
Calcium: 9.9 mg/dL (ref 8.6–10.3)
Chloride: 102 mmol/L (ref 98–110)
Creat: 0.82 mg/dL (ref 0.60–1.35)
GFR, Est African American: 121 mL/min/{1.73_m2} (ref >=60)
GFR, Est Non African American: 105 mL/min/{1.73_m2} (ref >=60)
Globulin: 2.5 g/dL (calc) (ref 1.9–3.7)
Glucose, Bld: 83 mg/dL (ref 65–99)
Potassium: 4.5 mmol/L (ref 3.5–5.3)
Sodium: 140 mmol/L (ref 135–146)
Total Bilirubin: 0.7 mg/dL (ref 0.2–1.2)
Total Protein: 7.5 g/dL (ref 6.1–8.1)

## 2019-03-31 LAB — CBC WITH DIFFERENTIAL/PLATELET
Absolute Monocytes: 553 cells/uL (ref 200–950)
Basophils Absolute: 28 cells/uL (ref 0–200)
Basophils Relative: 0.4 %
Eosinophils Absolute: 112 cells/uL (ref 15–500)
Eosinophils Relative: 1.6 %
HCT: 46.2 % (ref 38.5–50.0)
Hemoglobin: 15.7 g/dL (ref 13.2–17.1)
Lymphs Abs: 1582 cells/uL (ref 850–3900)
MCH: 32.6 pg (ref 27.0–33.0)
MCHC: 34 g/dL (ref 32.0–36.0)
MCV: 95.9 fL (ref 80.0–100.0)
MPV: 10.6 fL (ref 7.5–12.5)
Monocytes Relative: 7.9 %
Neutro Abs: 4725 cells/uL (ref 1500–7800)
Neutrophils Relative %: 67.5 %
Platelets: 234 10*3/uL (ref 140–400)
RBC: 4.82 10*6/uL (ref 4.20–5.80)
RDW: 13.5 % (ref 11.0–15.0)
Total Lymphocyte: 22.6 %
WBC: 7 10*3/uL (ref 3.8–10.8)

## 2019-03-31 LAB — TSH: TSH: 2.61 mIU/L (ref 0.40–4.50)

## 2019-03-31 LAB — IRON, TOTAL/TOTAL IRON BINDING CAP
%SAT: 20 % (calc) (ref 20–48)
Iron: 81 ug/dL (ref 50–180)
TIBC: 407 mcg/dL (calc) (ref 250–425)

## 2019-03-31 LAB — VITAMIN D 25 HYDROXY (VIT D DEFICIENCY, FRACTURES): Vit D, 25-Hydroxy: 60 ng/mL (ref 30–100)

## 2019-03-31 LAB — MAGNESIUM: Magnesium: 2 mg/dL (ref 1.5–2.5)

## 2019-03-31 LAB — VITAMIN B12: Vitamin B-12: 446 pg/mL (ref 200–1100)

## 2019-06-30 ENCOUNTER — Emergency Department (HOSPITAL_BASED_OUTPATIENT_CLINIC_OR_DEPARTMENT_OTHER)
Admission: EM | Admit: 2019-06-30 | Discharge: 2019-06-30 | Disposition: A | Discharge status: Home / Self Care | Payer: No Typology Code available for payment source | Attending: Emergency Medicine | Admitting: Emergency Medicine

## 2019-06-30 ENCOUNTER — Encounter (HOSPITAL_BASED_OUTPATIENT_CLINIC_OR_DEPARTMENT_OTHER): Payer: Self-pay

## 2019-06-30 ENCOUNTER — Other Ambulatory Visit: Payer: Self-pay

## 2019-06-30 ENCOUNTER — Emergency Department (HOSPITAL_BASED_OUTPATIENT_CLINIC_OR_DEPARTMENT_OTHER): Payer: No Typology Code available for payment source

## 2019-06-30 DIAGNOSIS — M25511 Pain in right shoulder: Secondary | ICD-10-CM | POA: Diagnosis not present

## 2019-06-30 DIAGNOSIS — Y990 Civilian activity done for income or pay: Secondary | ICD-10-CM | POA: Insufficient documentation

## 2019-06-30 DIAGNOSIS — Y9289 Other specified places as the place of occurrence of the external cause: Secondary | ICD-10-CM | POA: Diagnosis not present

## 2019-06-30 DIAGNOSIS — Z7982 Long term (current) use of aspirin: Secondary | ICD-10-CM | POA: Insufficient documentation

## 2019-06-30 DIAGNOSIS — Y9389 Activity, other specified: Secondary | ICD-10-CM | POA: Diagnosis not present

## 2019-06-30 DIAGNOSIS — X500XXA Overexertion from strenuous movement or load, initial encounter: Secondary | ICD-10-CM | POA: Diagnosis not present

## 2019-06-30 DIAGNOSIS — Z87891 Personal history of nicotine dependence: Secondary | ICD-10-CM | POA: Insufficient documentation

## 2019-06-30 DIAGNOSIS — Z79899 Other long term (current) drug therapy: Secondary | ICD-10-CM | POA: Diagnosis not present

## 2019-06-30 DIAGNOSIS — I10 Essential (primary) hypertension: Secondary | ICD-10-CM | POA: Insufficient documentation

## 2019-06-30 MED ORDER — MELOXICAM 15 MG PO TABS: 15.0000 mg | ORAL_TABLET | Freq: Every day | ORAL | 0 refills | Status: DC

## 2019-06-30 MED FILL — MELOXICAM 15 MG TABLET: 15 | 10 days supply | Qty: 10 | Fill #0

## 2019-06-30 NOTE — ED Notes (Signed)
ED Provider at bedside. 

## 2019-06-30 NOTE — Discharge Instructions (Addendum)
Your xray showed no acute abnormalities. You will need further evaluation with an orthopedic/sports med specialist. I have given you a referral to Dr. Raeford Razor for evaluation of your shoulder pain. Please follow closely for detailed evaluation.  Get help right away if: Your arm, hand, or fingers: Tingle. Become numb. Become swollen. Become painful. Turn white or blue.

## 2019-06-30 NOTE — ED Provider Notes (Signed)
[facility] EMERGENCY DEPARTMENT Provider Note   CSN: [id number] Arrival date & time: 06/30/19  0920     History Chief Complaint  Patient presents with  . Shoulder Pain    Jason Curry is a 48 y.o. male.   Shoulder Injury This is a new problem. Episode onset: 3 weeks ago, patient lifted a 40 lb bag of sand and felt pain in his R shoulder. The problem has not changed (characterized as sharp and aching) since onset.Pertinent negatives include no chest pain, no abdominal pain, no headaches and no shortness of breath. Exacerbated by: certain movements- reaching overhead esp. The symptoms are relieved by rest. The treatment provided no (patient has tried motrin and tylenol without much relief) relief.       Past Medical History:  Diagnosis Date  . Hematuria   . Hyperlipidemia   . Hypertension   . Hypogonadism male     Patient Active Problem List   Diagnosis Date Noted  . Hypokalemia 01/23/2019  . Acute diverticulitis 01/22/2019  . Abnormal glucose 06/13/2018  . Vitamin B12 deficiency 06/13/2018  . Testosterone deficiency 06/13/2018  . Former smoker 06/13/2018  . FHx: heart disease 06/13/2018  . Screening examination for pulmonary tuberculosis 06/13/2018  . Fatigue 06/13/2018  . Obesity (BMI 30.0-34.9) 08/09/2017  . Essential hypertension 06/13/2013  . Hyperlipidemia, mixed 06/13/2013  . Vitamin D deficiency 06/13/2013  . 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 Lumbago 06/13/2013  . Medication management 06/13/2013    History reviewed. No pertinent surgical history.     Family History  Problem Relation Age of Onset  . Cancer Mother        thyroid  . Heart disease Father   . Hypertension Father   . Hyperlipidemia Father     Social History   Tobacco Use  . Smoking status: Former Smoker    Quit date: 06/13/1993    Years since quitting: 26.0  . Smokeless tobacco: Current User    Types: Chew  Substance Use Topics  . Alcohol use: Yes   Alcohol/week: 1.0 standard drinks    Types: 1 Standard drinks or equivalent per week  . Drug use: No    Home Medications Prior to Admission medications   Medication Sig Start Date End Date Taking? Authorizing Provider  Ascorbic Acid (VITA-C PO) Take 1 tablet by mouth daily.     [provider]  aspirin 81 MG tablet Take 81 mg by mouth at bedtime.     [provider]  Cholecalciferol (VITAMIN D) 2000 units CAPS Take 2 capsules by mouth daily.    [provider]  Cyanocobalamin (VITAMIN B-12 SL) Place 1 tablet under the tongue at bedtime.     [provider]  ibuprofen (ADVIL) 200 MG tablet Take 400 mg by mouth daily as needed for headache.    [provider]  Magnesium 200 MG TABS Take 1 tablet by mouth at bedtime.     [provider]  meloxicam (MOBIC) 15 MG tablet Take 1 tablet (15 mg total) by mouth daily. Take 1 daily with food. 06/30/19   Arthor Captain, PA-C  rosuvastatin (CRESTOR) 40 MG tablet Takes 1/2 to 1 tablet daily for cholesterol Patient taking differently: Take 40 mg by mouth at bedtime.  12/27/18   Lucky Cowboy, MD  zinc gluconate 50 MG tablet Take 50 mg by mouth at bedtime.    [provider]    Allergies    Patient has no known allergies.  Review  of Systems   Review of Systems  Respiratory: Negative for shortness of breath.   Cardiovascular: Negative for chest pain.  Gastrointestinal: Negative for abdominal pain.  Musculoskeletal: Negative for back pain, joint swelling, neck pain and neck stiffness.  Skin: Negative for color change.  Neurological: Negative for tremors, weakness, numbness and headaches.    Physical Exam Updated Vital Signs BP (!) 136/99 (BP Location: Right Arm)   Pulse 100   Temp 98 F (36.7 C) (Oral)   Resp 18   Ht 5\' 7"  (1.702 m)   Wt 90.7 kg   SpO2 99%   BMI 31.32 kg/m   Physical Exam Vitals and nursing note reviewed.  Constitutional:      General: He is not in acute  distress.    Appearance: He is well-developed. He is not diaphoretic.  HENT:     Head: Normocephalic and atraumatic.  Eyes:     General: No scleral icterus.    Conjunctiva/sclera: Conjunctivae normal.  Cardiovascular:     Rate and Rhythm: Normal rate and regular rhythm.     Heart sounds: Normal heart sounds.  Pulmonary:     Effort: Pulmonary effort is normal. No respiratory distress.     Breath sounds: Normal breath sounds.  Abdominal:     Palpations: Abdomen is soft.     Tenderness: There is no abdominal tenderness.  Musculoskeletal:     Right shoulder: Normal.     Left shoulder: No swelling, deformity, effusion, tenderness, bony tenderness or crepitus. Normal range of motion.     Cervical back: Normal range of motion and neck supple.     Comments: Full AROM of the R shoulder with sharp pain when fully flexing the shoulder and reaching overhead. No ttp of the the bones or muscles. No tenderness over the coracoid or the biceps tendon groove. Negative cross arm test. Sharp pain with elbow in flexion and resisted pronation at the wrist.   Skin:    General: Skin is warm and dry.  Neurological:     Mental Status: He is alert.  Psychiatric:        Behavior: Behavior normal.     ED Results / Procedures / Treatments   Labs (all labs ordered are listed, but only abnormal results are displayed) Labs Reviewed - No data to display  EKG None  Radiology DG Shoulder Right  Result Date: 06/30/2019 CLINICAL DATA:  Pain after lifting heavy object EXAM: RIGHT SHOULDER - 2+ VIEW COMPARISON:  March 03, 2010 FINDINGS: Frontal, Y scapular, and axillary images were obtained. There is no fracture or dislocation. Joint spaces appear unremarkable. No erosive change. Visualized right lung clear. IMPRESSION: No fracture or dislocation.  No appreciable arthropathy. Electronically Signed   By: Bretta BangWilliam  Woodruff III M.D.   On: 06/30/2019 09:56    Procedures Procedures (including critical care  time)  Medications Ordered in ED Medications - No data to display  ED Course  I have reviewed the triage vital signs and the nursing notes.  Pertinent labs & imaging results that were available during my care of the patient were reviewed by me and considered in my medical decision making (see chart for details).    MDM Rules/Calculators/A&P                      Patient here with c/o sharp pain in the R shoulder with certain movements after lifting a heavy bag 2-3 weeks ago. He does not have tenderness to palpation but certain  movmenets illicit sharp pain in the shoulder.  DDX includes rotator cuff tear, impingement, labral tear. Doubt tendonitis or bursitis.I have reviewed the patient's shoulder film which shows no acute abnormalities on my interpretation.  He will need further evaluation with a specialist and may need further imaging including MRI. Patient appears otherwise appropriate for discharge with oral antiinflammatories. Return precautions given at dc Final Clinical Impression(s) / ED Diagnoses Final diagnoses:  Acute pain of right shoulder    Rx / DC Orders ED Discharge Orders         Ordered    meloxicam (MOBIC) 15 MG tablet  Daily,   Status:  Discontinued     06/30/19 1010    meloxicam (MOBIC) 15 MG tablet  Daily     06/30/19 1010           Margarita Mail, PA-C 06/30/19 1016    Charlesetta Shanks, MD 07/01/19 1242

## 2019-06-30 NOTE — ED Triage Notes (Signed)
Pt states that on Dec (th he was hoisting a bag of salt and hurt his shoulder, reports that the pain has not gotten any better since. Was sent by employer for evaluation.

## 2019-07-11 ENCOUNTER — Encounter: Payer: Self-pay | Admitting: Internal Medicine

## 2019-08-19 ENCOUNTER — Encounter: Payer: Self-pay | Admitting: Internal Medicine

## 2019-08-19 MED ORDER — ROSUVASTATIN CALCIUM 40 MG PO TABS: ORAL_TABLET | ORAL | 3 refills | Status: DC

## 2019-08-19 NOTE — Patient Instructions (Signed)

## 2019-08-19 NOTE — Progress Notes (Signed)
Annual  Screening/Preventative Visit  & Comprehensive Evaluation & Examination     This very nice 49 y.o. MWM presents for a Screening /Preventative Visit & comprehensive evaluation and management of multiple medical co-morbidities.  Patient has been followed for HTN, HLD, Prediabetes, Vit B12 Deficiency  and Vitamin D Deficiency.     Patient has been followed since 2010 for labile HTN. Patient's BP has been controlled at home.  Today's BP is borderline high normal diastolic today.  122/88. Patient denies any cardiac symptoms as chest pain, palpitations, shortness of breath, dizziness or ankle swelling.  BP Readings from Last 3 Encounters:  08/21/19 122/88  06/30/19 121/87  03/30/19 120/86       Patient's hyperlipidemia is controlled with diet and Rosuvastatin. Patient denies myalgias or other medication SE's. Last lipids were at goal:  Lab Results  Component Value Date   CHOL 171 03/30/2019   HDL 53 03/30/2019   LDLCALC 95 03/30/2019   TRIG 132 03/30/2019   CHOLHDL 3.2 03/30/2019      Patient his overweight (BMI 30+) and is monitored expectantly for glucose intolerance.  Patient denies reactive hypoglycemic symptoms, visual blurring, diabetic polys or paresthesias. Last A1c was Normal & at goal:  Lab Results  Component Value Date   HGBA1C 4.9 12/27/2018       Finally, patient has long history of Vitamin D Deficiency ("27" / 2008 - "32" / 2017" & "27" / 2018) and last vitamin D was at goal:  Lab Results  Component Value Date   VD25OH 60 03/30/2019   Current Outpatient Medications on File Prior to Visit  Medication Sig  . Ascorbic Acid / Vitamin C Take 1 tablet  daily.   Marland Kitchen aspirin 81 MG tablet Take  at bedtime.   Marland Kitchen VITAMIN D 2000 units CAPS Take 2 capsules  daily.  Marland Kitchen VITAMIN B-12 SL Place 1 tablet under the tongue at bedtime.   Marland Kitchen ibuprofen (200 MG tablet Take 1-2 tabs daily as needed for headache.  . Magnesium 200 MG TABS Take 1 tablet  at bedtime.   . meloxicam  15 MG  tablet Take 1 daily with food.  . rosuvastatin (CRESTOR) 40 MG tablet Takes 1 tablet daily for cholesterol   . zinc gluconate 50 MG tablet Take 50 mg at bedtime.    No Known Allergies   Past Medical History:  Diagnosis Date  . Hematuria   . Hyperlipidemia   . Hypertension   . Hypogonadism male    Health Maintenance  Topic Date Due  . INFLUENZA VACCINE  02/11/2019  . TETANUS/TDAP  05/04/2027  . HIV Screening  Completed   Immunization History  Administered Date(s) Administered  . Influenza-Unspecified 05/04/2018  . PPD Test 04/05/2014, 05/03/2017, 06/13/2018  . Pneumococcal Polysaccharide-23 11/29/2008  . Td 05/03/2017  . Tdap 09/16/2006   Last Colon -  History reviewed. No pertinent surgical history. Family History  Problem Relation Age of Onset  . Cancer Mother        thyroid  . Heart disease Father   . Hypertension Father   . Hyperlipidemia Father    Social History   Socioeconomic History  . Marital status: Single    Spouse name: Marylene Land  . Number of children: Not on file  . Years of education: Not on file  . Highest education level: Not on file  Occupational History  . Not on file  Tobacco Use  . Smoking status: Former Smoker    Quit date: 06/13/1993  Years since quitting: 26.2  . Smokeless tobacco: Current User    Types: Chew  Substance and Sexual Activity  . Alcohol use: Yes    Alcohol/week: 1.0 standard drinks    Types: 1 Standard drinks or equivalent per week  . Drug use: No  . Sexual activity: Not on file    ROS Constitutional: Denies fever, chills, weight loss/gain, headaches, insomnia,  night sweats or change in appetite. Does c/o fatigue. Eyes: Denies redness, blurred vision, diplopia, discharge, itchy or watery eyes.  ENT: Denies discharge, congestion, post nasal drip, epistaxis, sore throat, earache, hearing loss, dental pain, Tinnitus, Vertigo, Sinus pain or snoring.  Cardio: Denies chest pain, palpitations, irregular heartbeat, syncope,  dyspnea, diaphoresis, orthopnea, PND, claudication or edema Respiratory: denies cough, dyspnea, DOE, pleurisy, hoarseness, laryngitis or wheezing.  Gastrointestinal: Denies dysphagia, heartburn, reflux, water brash, pain, cramps, nausea, vomiting, bloating, diarrhea, constipation, hematemesis, melena, hematochezia, jaundice or hemorrhoids Genitourinary: Denies dysuria, frequency, urgency, nocturia, hesitancy, discharge, hematuria or flank pain Musculoskeletal: Denies arthralgia, myalgia, stiffness, Jt. Swelling, pain, limp or strain/sprain. Denies Falls. Skin: Denies puritis, rash, hives, warts, acne, eczema or change in skin lesion Neuro: No weakness, tremor, incoordination, spasms, paresthesia or pain Psychiatric: Denies confusion, memory loss or sensory loss. Denies Depression. Endocrine: Denies change in weight, skin, hair change, nocturia, and paresthesia, diabetic polys, visual blurring or hyper / hypo glycemic episodes.  Heme/Lymph: No excessive bleeding, bruising or enlarged lymph nodes.  Physical Exam  BP 122/88   Pulse 80   Temp (!) 97.2 F (36.2 C)   Resp 16   Ht 5' 6.5" (1.689 m)   Wt 209 lb (94.8 kg)   BMI 33.23 kg/m   General Appearance: Well nourished and well groomed and in no apparent distress.  Eyes: PERRLA, EOMs, conjunctiva no swelling or erythema, normal fundi and vessels. Sinuses: No frontal/maxillary tenderness ENT/Mouth: EACs patent / TMs  nl. Nares clear without erythema, swelling, mucoid exudates. Oral hygiene is good. No erythema, swelling, or exudate. Tongue normal, non-obstructing. Tonsils not swollen or erythematous. Hearing normal.  Neck: Supple, thyroid not palpable. No bruits, nodes or JVD. Respiratory: Respiratory effort normal.  BS equal and clear bilateral without rales, rhonci, wheezing or stridor. Cardio: Heart sounds are normal with regular rate and rhythm and no murmurs, rubs or gallops. Peripheral pulses are normal and equal bilaterally without  edema. No aortic or femoral bruits. Chest: symmetric with normal excursions and percussion.  Abdomen: Soft, with Nl bowel sounds. Nontender, no guarding, rebound, hernias, masses, or organomegaly.  Lymphatics: Non tender without lymphadenopathy.  Musculoskeletal: Full ROM all peripheral extremities, joint stability, 5/5 strength, and normal gait. Skin: Warm and dry without rashes, lesions, cyanosis, clubbing or  ecchymosis.  Neuro: Cranial nerves intact, reflexes equal bilaterally. Normal muscle tone, no cerebellar symptoms. Sensation intact.  Pysch: Alert and oriented X 3 with normal affect, insight and judgment appropriate.   Assessment and Plan  1. Annual Preventative/Screening Exam    2. Labile hypertension  - EKG 12-Lead - Urinalysis, Routine w reflex microscopic - Microalbumin / creatinine urine ratio - CBC with Differential/Platelet - COMPLETE METABOLIC PANEL WITH GFR - Magnesium - TSH  3. Hyperlipidemia, mixed  - EKG 12-Lead - Lipid panel - TSH  4. Abnormal glucose  - EKG 12-Lead - Hemoglobin A1c - Insulin, random  5. Vitamin D deficiency  - VITAMIN D 25 Hydroxy  6. Vitamin B12 deficiency  - Vitamin B12  7. Testosterone deficiency  - Testosterone  8. Screening for colorectal cancer  -  POC Hemoccult Bld/Stl   9. Screening examination for pulmonary tuberculosis  - TB Skin Test  10. BPH with obstruction/lower urinary tract symptoms  - PSA  11. Prostate cancer screening  - PSA  12. Screening for ischemic heart disease  - EKG 12-Lead  13. FHx: heart disease  - EKG 12-Lead  14. Former smoker  - EKG 12-Lead  15. Fatigue, unspecified type  - Iron,Total/Total Iron Binding Cap - Vitamin B12  16. Medication management  - Urinalysis, Routine w reflex microscopic - Microalbumin / creatinine urine ratio - CBC with Differential/Platelet - Magnesium - Lipid panel - TSH - Hemoglobin A1c - Insulin, random - VITAMIN D 25 Hydroxy         Patient was counseled in prudent diet, weight control to achieve/maintain BMI less than 25, BP monitoring, regular exercise and medications as discussed.  Discussed med effects and SE's. Routine screening labs and tests as requested with regular follow-up as recommended. Over 40 minutes of exam, counseling, chart review and high complex critical decision making was performed   Kirtland Bouchard, MD  This note is not being shared with the patient for the following reason: To prevent harm (release of this note would result in harm to the life or physical safety of the patient or another).

## 2019-08-21 ENCOUNTER — Other Ambulatory Visit: Payer: Self-pay

## 2019-08-21 ENCOUNTER — Ambulatory Visit: Payer: BC Managed Care – PPO | Admitting: Internal Medicine

## 2019-08-21 VITALS — BP 122/88 | HR 80 | Temp 97.2°F | Resp 16 | Ht 66.5 in | Wt 209.0 lb

## 2019-08-21 DIAGNOSIS — R350 Frequency of micturition: Secondary | ICD-10-CM | POA: Diagnosis not present

## 2019-08-21 DIAGNOSIS — Z136 Encounter for screening for cardiovascular disorders: Secondary | ICD-10-CM | POA: Diagnosis not present

## 2019-08-21 DIAGNOSIS — Z1389 Encounter for screening for other disorder: Secondary | ICD-10-CM | POA: Diagnosis not present

## 2019-08-21 DIAGNOSIS — Z111 Encounter for screening for respiratory tuberculosis: Secondary | ICD-10-CM

## 2019-08-21 DIAGNOSIS — Z1212 Encounter for screening for malignant neoplasm of rectum: Secondary | ICD-10-CM

## 2019-08-21 DIAGNOSIS — R0989 Other specified symptoms and signs involving the circulatory and respiratory systems: Secondary | ICD-10-CM | POA: Diagnosis not present

## 2019-08-21 DIAGNOSIS — E559 Vitamin D deficiency, unspecified: Secondary | ICD-10-CM

## 2019-08-21 DIAGNOSIS — Z125 Encounter for screening for malignant neoplasm of prostate: Secondary | ICD-10-CM | POA: Diagnosis not present

## 2019-08-21 DIAGNOSIS — Z130 Encounter for screening for diseases of the blood and blood-forming organs and certain disorders involving the immune mechanism: Secondary | ICD-10-CM | POA: Diagnosis not present

## 2019-08-21 DIAGNOSIS — R5383 Other fatigue: Secondary | ICD-10-CM

## 2019-08-21 DIAGNOSIS — E538 Deficiency of other specified B group vitamins: Secondary | ICD-10-CM

## 2019-08-21 DIAGNOSIS — Z1329 Encounter for screening for other suspected endocrine disorder: Secondary | ICD-10-CM | POA: Diagnosis not present

## 2019-08-21 DIAGNOSIS — E349 Endocrine disorder, unspecified: Secondary | ICD-10-CM

## 2019-08-21 DIAGNOSIS — Z87891 Personal history of nicotine dependence: Secondary | ICD-10-CM

## 2019-08-21 DIAGNOSIS — Z79899 Other long term (current) drug therapy: Secondary | ICD-10-CM

## 2019-08-21 DIAGNOSIS — Z8249 Family history of ischemic heart disease and other diseases of the circulatory system: Secondary | ICD-10-CM

## 2019-08-21 DIAGNOSIS — Z131 Encounter for screening for diabetes mellitus: Secondary | ICD-10-CM

## 2019-08-21 DIAGNOSIS — R7309 Other abnormal glucose: Secondary | ICD-10-CM

## 2019-08-21 DIAGNOSIS — Z0001 Encounter for general adult medical examination with abnormal findings: Secondary | ICD-10-CM

## 2019-08-21 DIAGNOSIS — Z13220 Encounter for screening for lipoid disorders: Secondary | ICD-10-CM | POA: Diagnosis not present

## 2019-08-21 DIAGNOSIS — N401 Enlarged prostate with lower urinary tract symptoms: Secondary | ICD-10-CM

## 2019-08-21 DIAGNOSIS — Z0000 Encounter for general adult medical examination without abnormal findings: Secondary | ICD-10-CM

## 2019-08-21 DIAGNOSIS — Z1211 Encounter for screening for malignant neoplasm of colon: Secondary | ICD-10-CM

## 2019-08-21 DIAGNOSIS — N138 Other obstructive and reflux uropathy: Secondary | ICD-10-CM

## 2019-08-21 DIAGNOSIS — E782 Mixed hyperlipidemia: Secondary | ICD-10-CM

## 2019-08-22 LAB — CBC WITH DIFFERENTIAL/PLATELET
Absolute Monocytes: 482 cells/uL (ref 200–950)
Basophils Absolute: 40 cells/uL (ref 0–200)
Basophils Relative: 0.6 %
Eosinophils Absolute: 141 cells/uL (ref 15–500)
Eosinophils Relative: 2.1 %
HCT: 45.1 % (ref 38.5–50.0)
Hemoglobin: 15.6 g/dL (ref 13.2–17.1)
Lymphs Abs: 1487 cells/uL (ref 850–3900)
MCH: 33.4 pg — ABNORMAL HIGH (ref 27.0–33.0)
MCHC: 34.6 g/dL (ref 32.0–36.0)
MCV: 96.6 fL (ref 80.0–100.0)
MPV: 10.8 fL (ref 7.5–12.5)
Monocytes Relative: 7.2 %
Neutro Abs: 4549 cells/uL (ref 1500–7800)
Neutrophils Relative %: 67.9 %
Platelets: 261 10*3/uL (ref 140–400)
RBC: 4.67 10*6/uL (ref 4.20–5.80)
RDW: 13 % (ref 11.0–15.0)
Total Lymphocyte: 22.2 %
WBC: 6.7 10*3/uL (ref 3.8–10.8)

## 2019-08-22 LAB — COMPLETE METABOLIC PANEL WITH GFR
AG Ratio: 2 (calc) (ref 1.0–2.5)
ALT: 38 U/L (ref 9–46)
AST: 26 U/L (ref 10–40)
Albumin: 4.9 g/dL (ref 3.6–5.1)
Alkaline phosphatase (APISO): 94 U/L (ref 36–130)
BUN: 17 mg/dL (ref 7–25)
CO2: 28 mmol/L (ref 20–32)
Calcium: 9.7 mg/dL (ref 8.6–10.3)
Chloride: 105 mmol/L (ref 98–110)
Creat: 0.81 mg/dL (ref 0.60–1.35)
GFR, Est African American: 122 mL/min/{1.73_m2} (ref >=60)
GFR, Est Non African American: 105 mL/min/{1.73_m2} (ref >=60)
Globulin: 2.5 g/dL (calc) (ref 1.9–3.7)
Glucose, Bld: 93 mg/dL (ref 65–99)
Potassium: 4.8 mmol/L (ref 3.5–5.3)
Sodium: 142 mmol/L (ref 135–146)
Total Bilirubin: 0.6 mg/dL (ref 0.2–1.2)
Total Protein: 7.4 g/dL (ref 6.1–8.1)

## 2019-08-22 LAB — URINALYSIS, ROUTINE W REFLEX MICROSCOPIC
Bacteria, UA: NONE SEEN /HPF
Bilirubin Urine: NEGATIVE
Glucose, UA: NEGATIVE
Hyaline Cast: NONE SEEN /LPF
Ketones, ur: NEGATIVE
Leukocytes,Ua: NEGATIVE
Nitrite: NEGATIVE
Protein, ur: NEGATIVE
Specific Gravity, Urine: 1.019 (ref 1.001–1.03)
Squamous Epithelial / HPF: NONE SEEN /HPF (ref <=5)
WBC, UA: NONE SEEN /HPF (ref 0–5)
pH: <=5 (ref 5.0–8.0)

## 2019-08-22 LAB — LIPID PANEL
Cholesterol: 147 mg/dL (ref <200)
HDL: 54 mg/dL (ref >=40)
LDL Cholesterol (Calc): 77 mg/dL (calc)
Non-HDL Cholesterol (Calc): 93 mg/dL (calc) (ref <130)
Total CHOL/HDL Ratio: 2.7 (calc) (ref <5.0)
Triglycerides: 81 mg/dL (ref <150)

## 2019-08-22 LAB — MICROALBUMIN / CREATININE URINE RATIO
Creatinine, Urine: 107 mg/dL (ref 20–320)
Microalb Creat Ratio: 26 mcg/mg creat (ref <30)
Microalb, Ur: 2.8 mg/dL

## 2019-08-22 LAB — HEMOGLOBIN A1C
Hgb A1c MFr Bld: 5 % of total Hgb (ref <5.7)
Mean Plasma Glucose: 97 (calc)
eAG (mmol/L): 5.4 (calc)

## 2019-08-22 LAB — IRON, TOTAL/TOTAL IRON BINDING CAP
%SAT: 28 % (calc) (ref 20–48)
Iron: 113 ug/dL (ref 50–180)
TIBC: 399 mcg/dL (calc) (ref 250–425)

## 2019-08-22 LAB — TESTOSTERONE: Testosterone: 220 ng/dL — ABNORMAL LOW (ref 250–827)

## 2019-08-22 LAB — PSA: PSA: 2.3 ng/mL (ref <=4.0)

## 2019-08-22 LAB — VITAMIN D 25 HYDROXY (VIT D DEFICIENCY, FRACTURES): Vit D, 25-Hydroxy: 56 ng/mL (ref 30–100)

## 2019-08-22 LAB — MAGNESIUM: Magnesium: 2.1 mg/dL (ref 1.5–2.5)

## 2019-08-22 LAB — INSULIN, RANDOM: Insulin: 9.1 u[IU]/mL

## 2019-08-22 LAB — TSH: TSH: 2.29 mIU/L (ref 0.40–4.50)

## 2019-08-22 LAB — VITAMIN B12: Vitamin B-12: 919 pg/mL (ref 200–1100)

## 2019-08-28 LAB — TB SKIN TEST
Induration: 0 mm
TB Skin Test: NEGATIVE

## 2019-11-22 ENCOUNTER — Ambulatory Visit: Payer: BC Managed Care – PPO | Admitting: Adult Health

## 2020-02-08 ENCOUNTER — Other Ambulatory Visit: Payer: Self-pay | Admitting: Internal Medicine

## 2020-02-08 DIAGNOSIS — E782 Mixed hyperlipidemia: Secondary | ICD-10-CM

## 2020-02-28 ENCOUNTER — Ambulatory Visit: Payer: BC Managed Care – PPO | Admitting: Internal Medicine

## 2020-08-21 NOTE — Progress Notes (Signed)
Annual  Screening/Preventative Visit  & Comprehensive Evaluation & Examination      This very nice 50 y.o. MWM presents for a Screening /Preventative Visit & comprehensive evaluation and management of multiple medical co-morbidities.  Patient has been followed for HTN, HLD, T2_NIDDM  Prediabetes and Vitamin D Deficiency.      Patient has remote hx/o elevated BP and has been followed since 2010 for labile HTN.   Patient's BP has been controlled and today's BP is at goal -  124/88. Patient denies any cardiac symptoms as chest pain, palpitations, shortness of breath, dizziness or ankle swelling.      Patient's hyperlipidemia is controlled with diet and medications. Patient denies myalgias or other medication SE's. Last lipids were at goal:  Lab Results  Component Value Date   CHOL 147 08/21/2019   HDL 54 08/21/2019   LDLCALC 77 08/21/2019   TRIG 81 08/21/2019   CHOLHDL 2.7 08/21/2019       Patient is moderately overweight   (BMI and is monitored expectantly for glucose intolerance and patient denies reactive hypoglycemic symptoms, visual blurring, diabetic polys or paresthesias. Last A1c was normal & at goal:   Lab Results  Component Value Date   HGBA1C 5.0 08/21/2019        The patient has history of Vitamin D Deficiency ("27" /2008- "32" /2017" & "31" /2018) and last vitamin D was still slightly low:   Lab Results  Component Value Date   VD25OH 56 08/21/2019    Current Outpatient Medications on File Prior to Visit  Medication Sig  . VITA-C Take 1 tablet daily.   Marland Kitchen aspirin 81 MG tablet Take  at bedtime.   Marland Kitchen VITAMIN D 2000 units  Take 5 capsules (10,000 u) daily.   Marland Kitchen VITAMIN B-12 SL Place 1 tablet under the tongue at bedtime.   Marland Kitchen ibuprofen  200 MG tablet Take 400 mg  daily as needed for headache.  . Magnesium 200 MG TABS Take 1 tablet  at bedtime.   . rosuvastatin 40 MG tablet TAKES 1/2 TO 1 TABLET DAILY   . zinc50 MG tablet Take at bedtime.    No Known  Allergies  Past Medical History:  Diagnosis Date  . Hematuria   . Hyperlipidemia   . Hypertension   . Hypogonadism male    Health Maintenance  Topic Date Due  . Hepatitis C Screening  Never done  . COLONOSCOPY (Pts 45-5yrs Insurance coverage will need to be confirmed)  Never done  . INFLUENZA VACCINE  02/11/2020  . COVID-19 Vaccine (3 - Booster for Pfizer series) 04/22/2020  . TETANUS/TDAP  05/04/2027  . HIV Screening  Completed   Immunization History  Administered Date(s) Administered  . Influenza-Unspecified 05/04/2018  . PFIZERSARS-COV-2 Vacc 09/24/2019, 10/22/2019  . PPD Test 04/05/2014, 05/03/2017, 06/13/2018, 08/21/2019  . Pneumococcal Polysaccharide-23 11/29/2008  . Td 05/03/2017  . Tdap 09/16/2006    No past surgical history on file.   Family History  Problem Relation Age of Onset  . Cancer Mother        thyroid  . Heart disease Father   . Hypertension Father   . Hyperlipidemia Father    Social History   Socioeconomic History  . Marital status: Married    Spouse name: Not on file  . Number of children: Not on file  Occupational History  .   Tobacco Use  . Smoking status: Former Smoker    Quit date: 06/13/1993    Years since quitting: 27.2  .  Smokeless tobacco: Current User    Types: Chew  Substance and Sexual Activity  . Alcohol use: Yes    Alcohol/week: 1.0 standard drink    Types: 1 Standard drinks or equivalent per week  . Drug use: No  . Sexual activity: Not on file    ROS Constitutional: Denies fever, chills, weight loss/gain, headaches, insomnia,  night sweats or change in appetite. Does c/o fatigue. Eyes: Denies redness, blurred vision, diplopia, discharge, itchy or watery eyes.  ENT: Denies discharge, congestion, post nasal drip, epistaxis, sore throat, earache, hearing loss, dental pain, Tinnitus, Vertigo, Sinus pain or snoring.  Cardio: Denies chest pain, palpitations, irregular heartbeat, syncope, dyspnea, diaphoresis, orthopnea, PND,  claudication or edema Respiratory: denies cough, dyspnea, DOE, pleurisy, hoarseness, laryngitis or wheezing.  Gastrointestinal: Denies dysphagia, heartburn, reflux, water brash, pain, cramps, nausea, vomiting, bloating, diarrhea, constipation, hematemesis, melena, hematochezia, jaundice or hemorrhoids Genitourinary: Denies dysuria, frequency, urgency, nocturia, hesitancy, discharge, hematuria or flank pain Musculoskeletal: Denies arthralgia, myalgia, stiffness, Jt. Swelling, pain, limp or strain/sprain. Denies Falls. Skin: Denies puritis, rash, hives, warts, acne, eczema or change in skin lesion Neuro: No weakness, tremor, incoordination, spasms, paresthesia or pain Psychiatric: Denies confusion, memory loss or sensory loss. Denies Depression. Endocrine: Denies change in weight, skin, hair change, nocturia, and paresthesia, diabetic polys, visual blurring or hyper / hypo glycemic episodes.  Heme/Lymph: No excessive bleeding, bruising or enlarged lymph nodes.  Physical Exam  BP 124/88   Pulse 95   Temp (!) 97.2 F (36.2 C)   Resp 16   Ht 5\' 6"  (1.676 m)   Wt 209 lb 3.2 oz (94.9 kg)   SpO2 97%   BMI 33.77 kg/m   General Appearance: Well nourished and well groomed and in no apparent distress.  Eyes: PERRLA, EOMs, conjunctiva no swelling or erythema, normal fundi and vessels. Sinuses: No frontal/maxillary tenderness ENT/Mouth: EACs patent / TMs  nl. Nares clear without erythema, swelling, mucoid exudates. Oral hygiene is good. No erythema, swelling, or exudate. Tongue normal, non-obstructing. Tonsils not swollen or erythematous. Hearing normal.  Neck: Supple, thyroid not palpable. No bruits, nodes or JVD. Respiratory: Respiratory effort normal.  BS equal and clear bilateral without rales, rhonci, wheezing or stridor. Cardio: Heart sounds are normal with regular rate and rhythm and no murmurs, rubs or gallops. Peripheral pulses are normal and equal bilaterally without edema. No aortic or  femoral bruits. Chest: symmetric with normal excursions and percussion.  Abdomen: Soft, with Nl bowel sounds. Nontender, no guarding, rebound, hernias, masses, or organomegaly.  Lymphatics: Non tender without lymphadenopathy.  Musculoskeletal: Full ROM all peripheral extremities, joint stability, 5/5 strength, and normal gait. Skin: Warm and dry without rashes, lesions, cyanosis, clubbing or  ecchymosis.  Neuro: Cranial nerves intact, reflexes equal bilaterally. Normal muscle tone, no cerebellar symptoms. Sensation intact.  Pysch: Alert and oriented X 3 with normal affect, insight and judgment appropriate.   Assessment and Plan  1. Annual Preventative/Screening Exam    2. Labile hypertension  - EKG 12-Lead - Urinalysis, Routine w reflex microscopic - Microalbumin / creatinine urine ratio - CBC with Differential/Platelet - COMPLETE METABOLIC PANEL WITH GFR - Magnesium - TSH  3. Hyperlipidemia, mixed  - EKG 12-Lead - Lipid panel - TSH  4. Abnormal glucose  - EKG 12-Lead - Hemoglobin A1c - Insulin, random  5. Vitamin D deficiency  - VITAMIN D 25 Hydroxy   6. Vitamin B12 deficiency   7. Testosterone deficiency  - Testosterone  8. Screening for colorectal cancer   9.  Screening examination for pulmonary tuberculosis  - TB Skin Test  10. BPH with obstruction/lower urinary tract symptoms  - PSA  11. Prostate cancer screening  - PSA  12. Screening for ischemic heart disease  - EKG 12-Lead  13. FHx: heart disease  - EKG 12-Lead  14. Former smoker  - EKG 12-Lead  15. Fatigue  - Testosterone - CBC with Differential/Platelet - TSH  16. Medication management  - Urinalysis, Routine w reflex microscopic - Microalbumin / creatinine urine ratio - CBC with Differential/Platelet - COMPLETE METABOLIC PANEL WITH GFR - Magnesium - Lipid panel - TSH - Hemoglobin A1c - Insulin, random - VITAMIN D 25 Hydroxy   17. Screening-pulmonary TB  - TB Skin  Test           Patient was counseled in prudent diet, weight control to achieve/maintain BMI less than 25, BP monitoring, regular exercise and medications as discussed.  Discussed med effects and SE's. Routine screening labs and tests as requested with regular follow-up as recommended. Over 40 minutes of exam, counseling, chart review and high complex critical decision making was performed   Marinus Maw, MD

## 2020-08-21 NOTE — Patient Instructions (Signed)

## 2020-08-22 ENCOUNTER — Ambulatory Visit (INDEPENDENT_AMBULATORY_CARE_PROVIDER_SITE_OTHER): Payer: BC Managed Care – PPO | Admitting: Internal Medicine

## 2020-08-22 ENCOUNTER — Other Ambulatory Visit: Payer: Self-pay

## 2020-08-22 VITALS — BP 124/88 | HR 95 | Temp 97.2°F | Resp 16 | Ht 66.0 in | Wt 209.2 lb

## 2020-08-22 DIAGNOSIS — Z125 Encounter for screening for malignant neoplasm of prostate: Secondary | ICD-10-CM | POA: Diagnosis not present

## 2020-08-22 DIAGNOSIS — N401 Enlarged prostate with lower urinary tract symptoms: Secondary | ICD-10-CM | POA: Diagnosis not present

## 2020-08-22 DIAGNOSIS — E559 Vitamin D deficiency, unspecified: Secondary | ICD-10-CM

## 2020-08-22 DIAGNOSIS — E782 Mixed hyperlipidemia: Secondary | ICD-10-CM

## 2020-08-22 DIAGNOSIS — N138 Other obstructive and reflux uropathy: Secondary | ICD-10-CM

## 2020-08-22 DIAGNOSIS — Z1329 Encounter for screening for other suspected endocrine disorder: Secondary | ICD-10-CM

## 2020-08-22 DIAGNOSIS — Z79899 Other long term (current) drug therapy: Secondary | ICD-10-CM

## 2020-08-22 DIAGNOSIS — E538 Deficiency of other specified B group vitamins: Secondary | ICD-10-CM

## 2020-08-22 DIAGNOSIS — Z0000 Encounter for general adult medical examination without abnormal findings: Secondary | ICD-10-CM

## 2020-08-22 DIAGNOSIS — E349 Endocrine disorder, unspecified: Secondary | ICD-10-CM

## 2020-08-22 DIAGNOSIS — Z1389 Encounter for screening for other disorder: Secondary | ICD-10-CM | POA: Diagnosis not present

## 2020-08-22 DIAGNOSIS — Z1211 Encounter for screening for malignant neoplasm of colon: Secondary | ICD-10-CM

## 2020-08-22 DIAGNOSIS — Z131 Encounter for screening for diabetes mellitus: Secondary | ICD-10-CM

## 2020-08-22 DIAGNOSIS — Z13220 Encounter for screening for lipoid disorders: Secondary | ICD-10-CM

## 2020-08-22 DIAGNOSIS — Z111 Encounter for screening for respiratory tuberculosis: Secondary | ICD-10-CM | POA: Diagnosis not present

## 2020-08-22 DIAGNOSIS — R0989 Other specified symptoms and signs involving the circulatory and respiratory systems: Secondary | ICD-10-CM | POA: Diagnosis not present

## 2020-08-22 DIAGNOSIS — Z8249 Family history of ischemic heart disease and other diseases of the circulatory system: Secondary | ICD-10-CM | POA: Diagnosis not present

## 2020-08-22 DIAGNOSIS — I10 Essential (primary) hypertension: Secondary | ICD-10-CM | POA: Diagnosis not present

## 2020-08-22 DIAGNOSIS — R7309 Other abnormal glucose: Secondary | ICD-10-CM

## 2020-08-22 DIAGNOSIS — Z87891 Personal history of nicotine dependence: Secondary | ICD-10-CM

## 2020-08-22 DIAGNOSIS — Z0001 Encounter for general adult medical examination with abnormal findings: Secondary | ICD-10-CM

## 2020-08-22 DIAGNOSIS — R350 Frequency of micturition: Secondary | ICD-10-CM | POA: Diagnosis not present

## 2020-08-22 DIAGNOSIS — R5383 Other fatigue: Secondary | ICD-10-CM

## 2020-08-22 DIAGNOSIS — Z136 Encounter for screening for cardiovascular disorders: Secondary | ICD-10-CM

## 2020-08-23 LAB — URINALYSIS, ROUTINE W REFLEX MICROSCOPIC
Bacteria, UA: NONE SEEN /HPF
Bilirubin Urine: NEGATIVE
Glucose, UA: NEGATIVE
Hgb urine dipstick: NEGATIVE
Hyaline Cast: NONE SEEN /LPF
Ketones, ur: NEGATIVE
Leukocytes,Ua: NEGATIVE
Nitrite: NEGATIVE
Specific Gravity, Urine: 1.02 (ref 1.001–1.03)
Squamous Epithelial / HPF: NONE SEEN /HPF (ref <=5)
WBC, UA: NONE SEEN /HPF (ref 0–5)
pH: 7.5 (ref 5.0–8.0)

## 2020-08-23 LAB — COMPLETE METABOLIC PANEL WITH GFR
AG Ratio: 1.8 (calc) (ref 1.0–2.5)
ALT: 43 U/L (ref 9–46)
AST: 35 U/L (ref 10–40)
Albumin: 4.8 g/dL (ref 3.6–5.1)
Alkaline phosphatase (APISO): 118 U/L (ref 36–130)
BUN: 20 mg/dL (ref 7–25)
CO2: 31 mmol/L (ref 20–32)
Calcium: 9.9 mg/dL (ref 8.6–10.3)
Chloride: 104 mmol/L (ref 98–110)
Creat: 0.95 mg/dL (ref 0.60–1.35)
GFR, Est African American: 108 mL/min/{1.73_m2} (ref >=60)
GFR, Est Non African American: 94 mL/min/{1.73_m2} (ref >=60)
Globulin: 2.7 g/dL (calc) (ref 1.9–3.7)
Glucose, Bld: 91 mg/dL (ref 65–99)
Potassium: 4.6 mmol/L (ref 3.5–5.3)
Sodium: 144 mmol/L (ref 135–146)
Total Bilirubin: 0.8 mg/dL (ref 0.2–1.2)
Total Protein: 7.5 g/dL (ref 6.1–8.1)

## 2020-08-23 LAB — LIPID PANEL
Cholesterol: 169 mg/dL (ref <200)
HDL: 55 mg/dL (ref >=40)
LDL Cholesterol (Calc): 82 mg/dL (calc)
Non-HDL Cholesterol (Calc): 114 mg/dL (calc) (ref <130)
Total CHOL/HDL Ratio: 3.1 (calc) (ref <5.0)
Triglycerides: 220 mg/dL — ABNORMAL HIGH (ref <150)

## 2020-08-23 LAB — TSH: TSH: 2.33 mIU/L (ref 0.40–4.50)

## 2020-08-23 LAB — CBC WITH DIFFERENTIAL/PLATELET
Absolute Monocytes: 537 cells/uL (ref 200–950)
Basophils Absolute: 40 cells/uL (ref 0–200)
Basophils Relative: 0.5 %
Eosinophils Absolute: 158 cells/uL (ref 15–500)
Eosinophils Relative: 2 %
HCT: 46.5 % (ref 38.5–50.0)
Hemoglobin: 15.9 g/dL (ref 13.2–17.1)
Lymphs Abs: 1351 cells/uL (ref 850–3900)
MCH: 33.4 pg — ABNORMAL HIGH (ref 27.0–33.0)
MCHC: 34.2 g/dL (ref 32.0–36.0)
MCV: 97.7 fL (ref 80.0–100.0)
MPV: 10.4 fL (ref 7.5–12.5)
Monocytes Relative: 6.8 %
Neutro Abs: 5814 cells/uL (ref 1500–7800)
Neutrophils Relative %: 73.6 %
Platelets: 253 10*3/uL (ref 140–400)
RBC: 4.76 10*6/uL (ref 4.20–5.80)
RDW: 13.1 % (ref 11.0–15.0)
Total Lymphocyte: 17.1 %
WBC: 7.9 10*3/uL (ref 3.8–10.8)

## 2020-08-23 LAB — PSA: PSA: 0.96 ng/mL (ref <=4.0)

## 2020-08-23 LAB — MICROALBUMIN / CREATININE URINE RATIO
Creatinine, Urine: 104 mg/dL (ref 20–320)
Microalb Creat Ratio: 56 mcg/mg creat — ABNORMAL HIGH (ref <30)
Microalb, Ur: 5.8 mg/dL

## 2020-08-23 LAB — INSULIN, RANDOM: Insulin: 20.8 u[IU]/mL — ABNORMAL HIGH

## 2020-08-23 LAB — MAGNESIUM: Magnesium: 2.2 mg/dL (ref 1.5–2.5)

## 2020-08-23 LAB — VITAMIN D 25 HYDROXY (VIT D DEFICIENCY, FRACTURES): Vit D, 25-Hydroxy: 39 ng/mL (ref 30–100)

## 2020-08-23 LAB — HEMOGLOBIN A1C
Hgb A1c MFr Bld: 5 % of total Hgb (ref <5.7)
Mean Plasma Glucose: 97 mg/dL
eAG (mmol/L): 5.4 mmol/L

## 2020-08-23 LAB — TESTOSTERONE: Testosterone: 190 ng/dL — ABNORMAL LOW (ref 250–827)

## 2020-08-24 ENCOUNTER — Encounter: Payer: Self-pay | Admitting: Internal Medicine

## 2020-08-24 NOTE — Progress Notes (Signed)
========================================================== -   Test results slightly outside the reference range are not unusual. If there is anything important, I will review this with you,  otherwise it is considered normal test values.  If you have further questions,  please do not hesitate to contact me at the office or via My Chart.  ========================================================== ==========================================================  -  PSA - Low - Great  !  ========================================================== ==========================================================  -  Testosterone is Low - taking Zinc  (which you're taking) helps                                                                                raise Testosterone levels   - Also Daily Exercise   &    Weight Loss can help raise Testosterone levels  ========================================================== ==========================================================  -  Total Chol = 169  and LDL Chol = 82 - Both  Excellent   - Very low risk for Heart Attack  / Stroke ========================================================  - A1c - Normal - Great - No Diabetes  ========================================================== ==========================================================  -  Vitamin D = 39 is Extremely Low   - Vitamin D goal is between 70-100.   - Please make sure that you are taking your                                                  Vitamin D 10,000 units /day as recommended   - It is very important as a natural anti-inflammatory and helping the  immune system protect against viral infections, like the Covid-19    helping hair, skin, and nails, as well as reducing stroke and  heart attack risk.   - It helps your bones and helps with mood.  - It also decreases numerous cancer risks so please  take it as directed.   - Low Vit D is associated with a 200-300% higher risk  for  CANCER   and 200-300% higher risk for HEART   ATTACK  &  STROKE.    - It is also associated with higher death rate at younger ages,   autoimmune diseases like Rheumatoid arthritis, Lupus,  Multiple Sclerosis.     - Also many other serious conditions, like depression, Alzheimer's  Dementia, infertility, muscle aches, fatigue, fibromyalgia   - just to name a few. ==========================================================  All Else - CBC - Kidneys - Electrolytes - Liver - Magnesium & Thyroid    - all  Normal / OK ===========================================================  =========================================================== ===========================================================

## 2020-11-18 IMAGING — DX DG SHOULDER 2+V*R*
3 series · 3 of 3 positions shown · non-contrast
Comparison: March 03, 2010

CLINICAL DATA: Pain after lifting heavy object

EXAM:
RIGHT SHOULDER - 2+ VIEW

[shoulder grashey]
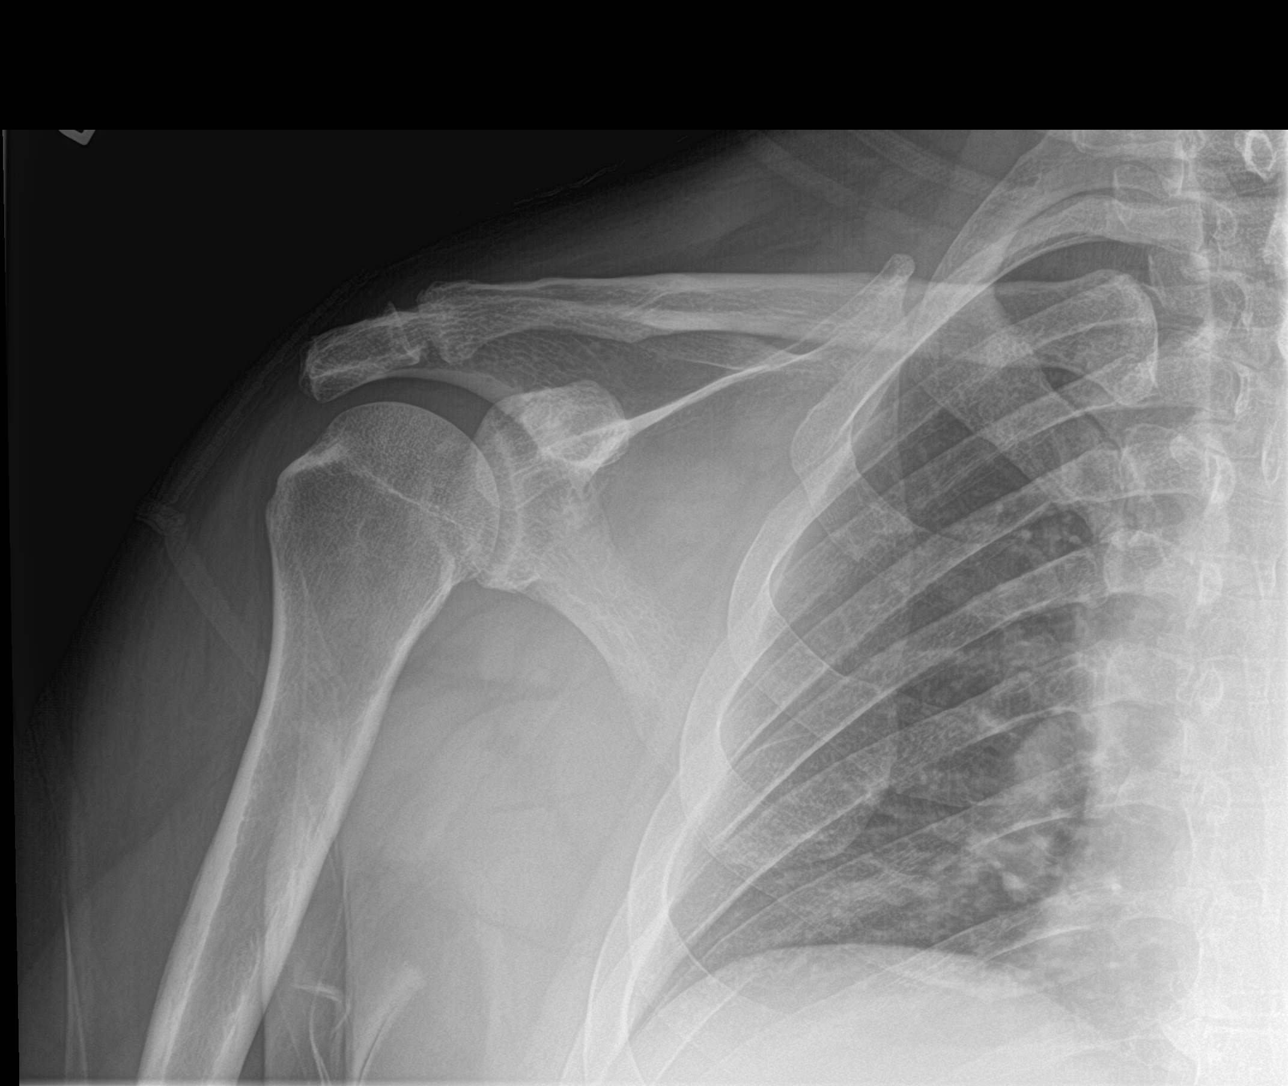

[shoulder y view]
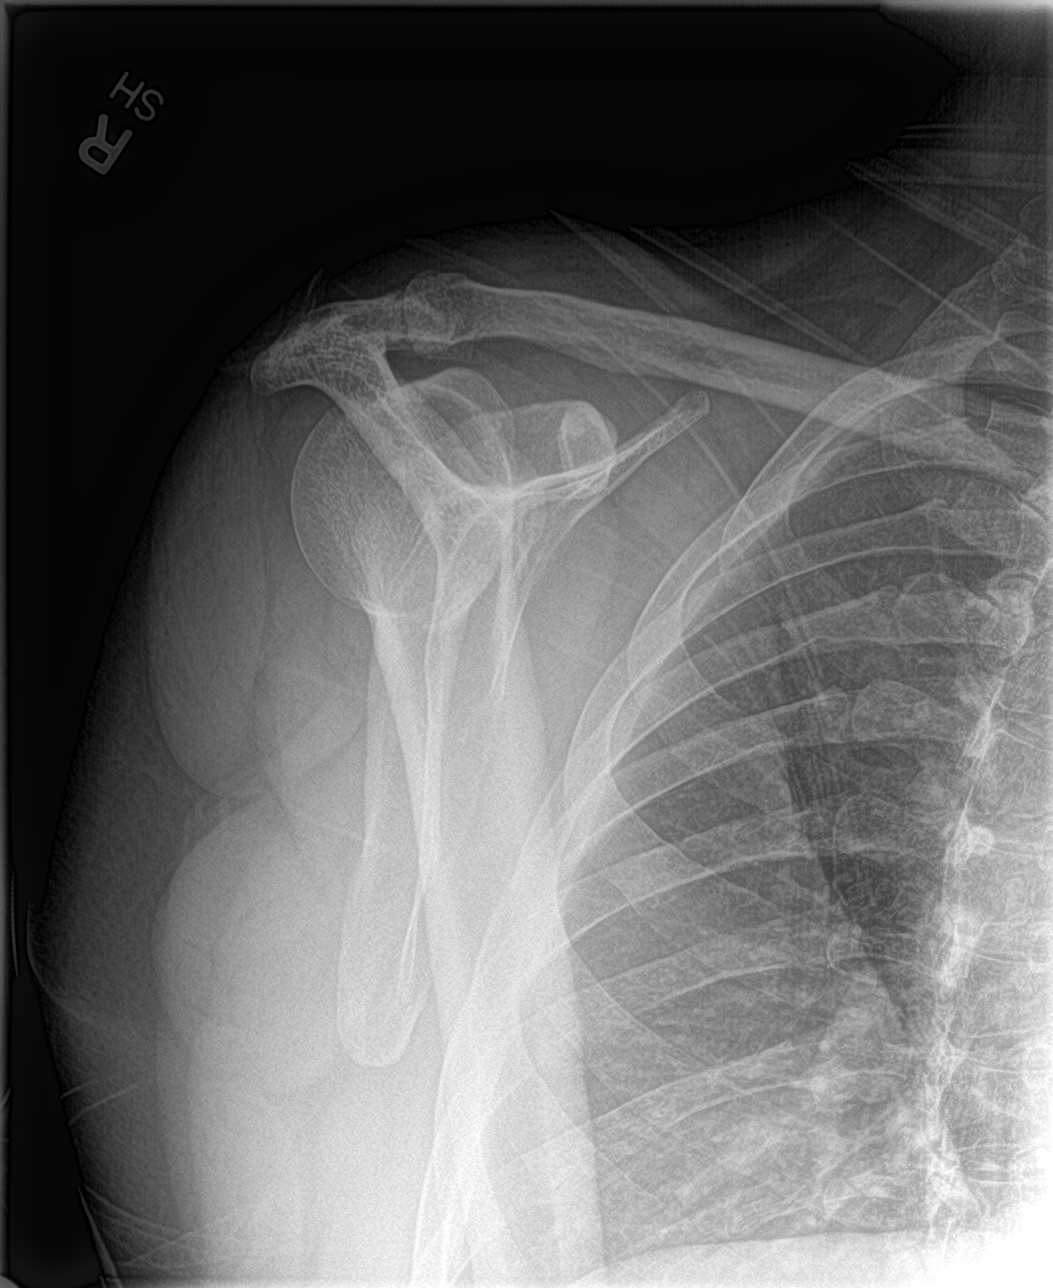

[shoulder axillary]
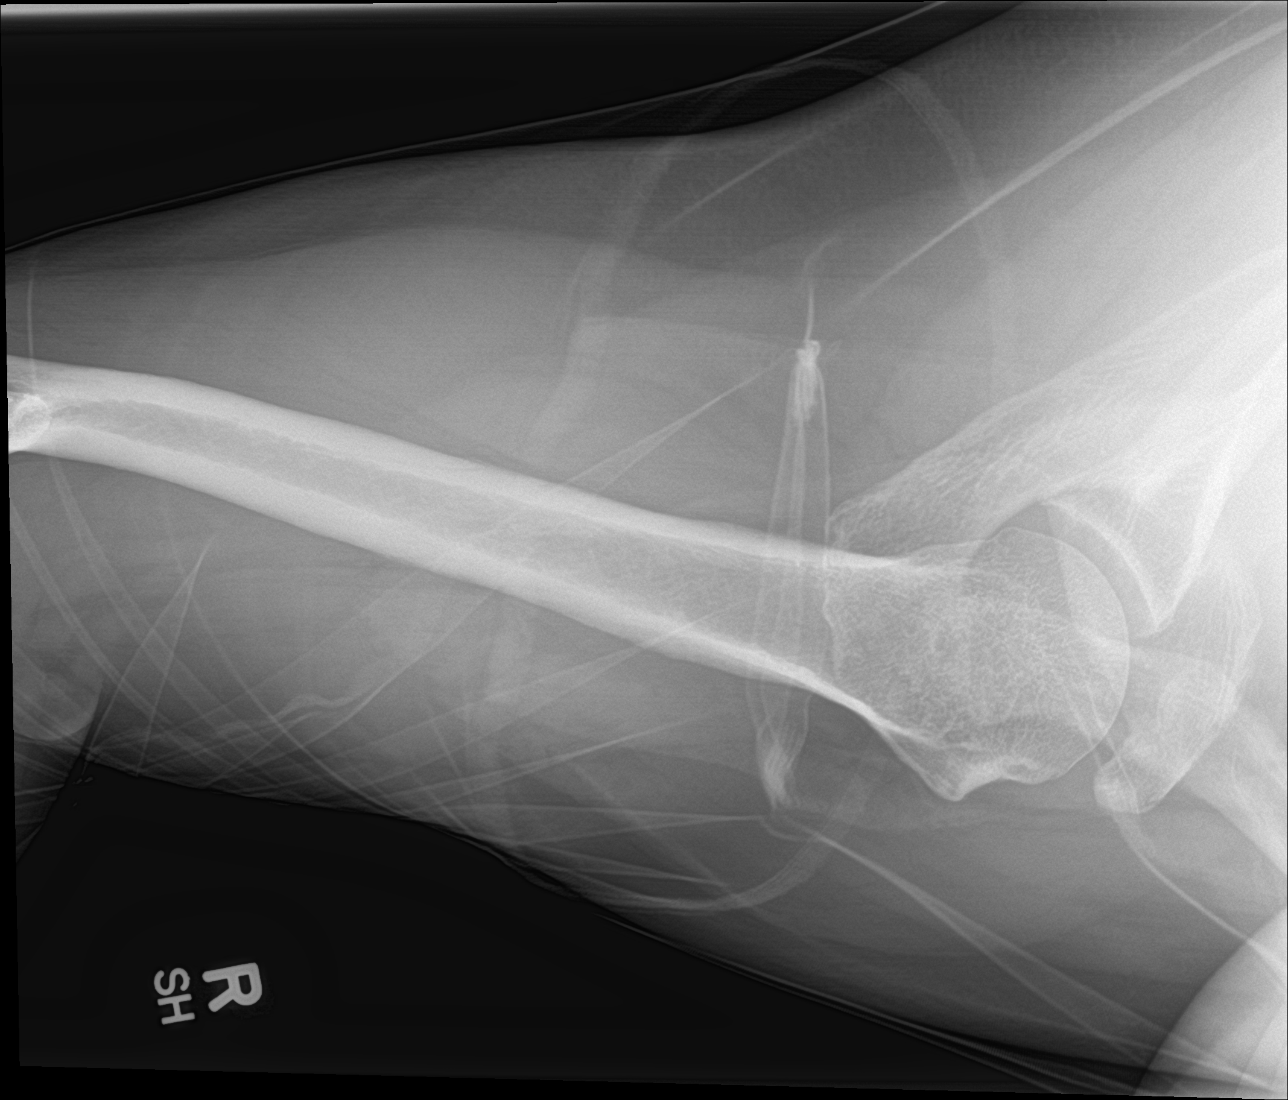

[3 of 3 positions shown; findings below may reference images not displayed]

FINDINGS: Frontal, Y scapular, and axillary images were obtained. There is no
fracture or dislocation. Joint spaces appear unremarkable. No
erosive change. Visualized right lung clear.
IMPRESSION: No fracture or dislocation.  No appreciable arthropathy.

## 2021-02-19 ENCOUNTER — Encounter: Payer: Self-pay | Admitting: Adult Health

## 2021-02-19 DIAGNOSIS — E8881 Metabolic syndrome: Secondary | ICD-10-CM | POA: Insufficient documentation

## 2021-02-19 NOTE — Progress Notes (Signed)
FOLLOW UP  Assessment and Plan:   Hypertension Fairly controlled off of meds at this time Monitor blood pressure at home; patient to call if consistently greater than 130/80 Continue DASH diet.   Reminder to go to the ER if any CP, SOB, nausea, dizziness, severe HA, changes vision/speech, left arm numbness and tingling and jaw pain.  Cholesterol Currently at goal, refilled rosuvastatin 20 mg daily Continue low cholesterol diet and exercise.  Check lipid panel.   Obesity with co morbidities Long discussion about weight loss, diet, and exercise -  Recommended diet heavy in fruits and veggies and low in animal meats, cheeses, and dairy products, appropriate calorie intake Discussed ideal weight for height Patient will work on continue current good diet and reduce beer intake Will follow up in 3 months  Vitamin D Def Continue supplement; check levels annually   Chewing tobacco use Discussed risks associated with tobacco use and advised to reduce or quit Patient is ready to do so and plans to try sugar free gum at work instead Declines Chantix or other medication Will follow up at the next visit  Pain of toe of right foot Lifestyle discussed, reduce beer intake, weight loss encouraged Declines daily agent, continue tart cherry, NSAID, will give colchicine to try PRN -     Uric acid -     colchicine 0.6 MG tablet; Take 2 tabs with onset of gout flare, then 1 tab daily as needed.  Continue diet and meds as discussed. Further disposition pending results of labs. Discussed med's effects and SE's.   Over 30 minutes of exam, counseling, chart review, and critical decision making was performed.   Future Appointments  Date Time Provider Department Center  09/01/2021  3:00 PM Lucky Cowboy, MD [facility] None    ----------------------------------------------------------------------------------------------------------------------  HPI 50 y.o. male  presents for 6 month follow up  on hypertension, cholesterol, glucose management, obesity, testosterone def and vitamin D deficiency.   He reports has recurrent R toe pain flares, he suspects gout as father had similar, started 10 years ago, typically only 1 flare/year and resolves within 2-3 days. Has never discussed with [place], never had uric acid levels checked. This episode is ongoing for 4 weeks, improving. He is taking tart cherry and NSAID. Admits onset with increased beer and shellfish while on vacation.   BMI is Body mass index is 34.86 kg/m., he has been working on diet but admits hasn't been taking the time to pack lunch for work. He has cut down on red meat, minimal, trying to do more fish. Eats a lot of beans. Lots of veggies. He admits beer holds him back, none or 6 pack, up to 18 in a weekend. Receptive to reducing.  He does work a physically intense job in Production designer, theatre/television/film.  Wt Readings from Last 3 Encounters:  02/20/21 216 lb (98 kg)  08/22/20 209 lb 3.2 oz (94.9 kg)  08/21/19 209 lb (94.8 kg)   Today their BP is BP: 124/86  He does not workout. He denies chest pain, shortness of breath, dizziness.   He is on cholesterol medication - he is on rosuvastatin 20 mg daily, and denies myalgias. His cholesterol is not at goal. The cholesterol last visit was:   Lab Results  Component Value Date   CHOL 169 08/22/2020   HDL 55 08/22/2020   LDLCALC 82 08/22/2020   TRIG 220 (H) 08/22/2020   CHOLHDL 3.1 08/22/2020    He has been working on diet and exercise for glucose  management, and denies increased appetite, nausea, paresthesia of the feet, polydipsia, polyuria, visual disturbances and vomiting. Last A1C in the office was:  Lab Results  Component Value Date   HGBA1C 5.0 08/22/2020   Patient is on Vitamin D supplement, taking 59563 IU daily:     Lab Results  Component Value Date   VD25OH 39 08/22/2020     He has testosterone def, he is currently taking zinc 50 mg daily, stopped shots and didn't notice a difference.   Lab Results  Component Value Date   TESTOSTERONE 190 (L) 08/22/2020   He continues on daily B12 supplement, doing well with SL  Lab Results  Component Value Date   VITAMINB12 919 08/21/2019    Current Medications:  Current Outpatient Medications on File Prior to Visit  Medication Sig   Ascorbic Acid (VITA-C PO) Take 1 tablet by mouth daily.    aspirin 81 MG tablet Take 81 mg by mouth at bedtime.    Cholecalciferol (VITAMIN D) 2000 units CAPS Take 5 capsules by mouth daily.    Cyanocobalamin (VITAMIN B-12 SL) Place 1 tablet under the tongue at bedtime.    ibuprofen (ADVIL) 200 MG tablet Take 400 mg by mouth daily as needed for headache.   Magnesium 200 MG TABS Take 1 tablet by mouth at bedtime.    zinc gluconate 50 MG tablet Take 50 mg by mouth at bedtime.   No current facility-administered medications on file prior to visit.     Allergies: No Known Allergies   Medical History:  Past Medical History:  Diagnosis Date   Diverticulitis 01/22/2019   Hematuria    Hyperlipidemia    Hypertension    Hypogonadism male    Family history- Reviewed and unchanged Social history- Reviewed and unchanged   Review of Systems:  Review of Systems  Constitutional:  Negative for malaise/fatigue and weight loss.  HENT:  Negative for hearing loss and tinnitus.   Eyes:  Negative for blurred vision and double vision.  Respiratory:  Negative for cough, shortness of breath and wheezing.   Cardiovascular:  Negative for chest pain, palpitations, orthopnea, claudication and leg swelling.  Gastrointestinal:  Negative for abdominal pain, blood in stool, constipation, diarrhea, heartburn, melena, nausea and vomiting.  Genitourinary: Negative.   Musculoskeletal:  Positive for joint pain (R toe). Negative for myalgias.  Skin:  Negative for rash.  Neurological:  Negative for dizziness, tingling, sensory change, weakness and headaches.  Endo/Heme/Allergies:  Negative for polydipsia.   Psychiatric/Behavioral: Negative.    All other systems reviewed and are negative.    Physical Exam: BP 124/86   Pulse 95   Temp (!) 97.2 F (36.2 C)   Wt 216 lb (98 kg)   SpO2 97%   BMI 34.86 kg/m  Wt Readings from Last 3 Encounters:  02/20/21 216 lb (98 kg)  08/22/20 209 lb 3.2 oz (94.9 kg)  08/21/19 209 lb (94.8 kg)   General Appearance: Well nourished, in no apparent distress. Eyes: PERRLA, EOMs, conjunctiva no swelling or erythema Sinuses: No Frontal/maxillary tenderness ENT/Mouth: Ext aud canals clear, TMs without erythema, bulging. No erythema, swelling, or exudate on post pharynx.  Tonsils not swollen or erythematous. Hearing normal.  Neck: Supple, thyroid normal.  Respiratory: Respiratory effort normal, BS equal bilaterally without rales, rhonchi, wheezing or stridor.  Cardio: RRR with no MRGs. Brisk peripheral pulses without edema.  Abdomen: Soft, obese abdomen, + BS.  Non tender, no guarding, rebound, hernias, masses. Lymphatics: Non tender without lymphadenopathy.  Musculoskeletal: Full  ROM, 5/5 strength, Normal gait. R toe MTP joint with mild bony enlargement and erythema, no heat or bogginess.  Skin: Warm, dry without rashes, lesions, ecchymosis.  Neuro: Cranial nerves intact. No cerebellar symptoms.  Psych: Awake and oriented X 3, normal affect, Insight and Judgment appropriate.    Dan Maker, NP 10:49 AM [facility]

## 2021-02-20 ENCOUNTER — Ambulatory Visit (INDEPENDENT_AMBULATORY_CARE_PROVIDER_SITE_OTHER): Payer: BC Managed Care – PPO | Admitting: Adult Health

## 2021-02-20 ENCOUNTER — Encounter: Payer: Self-pay | Admitting: Adult Health

## 2021-02-20 ENCOUNTER — Other Ambulatory Visit: Payer: Self-pay

## 2021-02-20 VITALS — BP 124/86 | HR 95 | Temp 97.2°F | Wt 216.0 lb

## 2021-02-20 DIAGNOSIS — E538 Deficiency of other specified B group vitamins: Secondary | ICD-10-CM

## 2021-02-20 DIAGNOSIS — R7309 Other abnormal glucose: Secondary | ICD-10-CM | POA: Diagnosis not present

## 2021-02-20 DIAGNOSIS — Z79899 Other long term (current) drug therapy: Secondary | ICD-10-CM

## 2021-02-20 DIAGNOSIS — M79674 Pain in right toe(s): Secondary | ICD-10-CM | POA: Diagnosis not present

## 2021-02-20 DIAGNOSIS — E782 Mixed hyperlipidemia: Secondary | ICD-10-CM | POA: Diagnosis not present

## 2021-02-20 DIAGNOSIS — E349 Endocrine disorder, unspecified: Secondary | ICD-10-CM

## 2021-02-20 DIAGNOSIS — E8881 Metabolic syndrome: Secondary | ICD-10-CM

## 2021-02-20 DIAGNOSIS — E669 Obesity, unspecified: Secondary | ICD-10-CM

## 2021-02-20 DIAGNOSIS — Z720 Tobacco use: Secondary | ICD-10-CM | POA: Insufficient documentation

## 2021-02-20 DIAGNOSIS — I10 Essential (primary) hypertension: Secondary | ICD-10-CM

## 2021-02-20 DIAGNOSIS — E559 Vitamin D deficiency, unspecified: Secondary | ICD-10-CM

## 2021-02-20 MED ORDER — ROSUVASTATIN CALCIUM 20 MG PO TABS
ORAL_TABLET | ORAL | 3 refills | Status: DC
Start: 2021-02-20 — End: 2022-09-06

## 2021-02-20 MED ORDER — COLCHICINE 0.6 MG PO TABS: ORAL_TABLET | ORAL | 2 refills | Status: DC

## 2021-02-20 NOTE — Patient Instructions (Addendum)
Goals      Blood Pressure < 130/80     Quit Chewing Tobacco     Try sugar free gum at work     Reduce alcohol intake     Try to limit to 2-3/day, ideally <7/week     Weight (lb) < 200 lb (90.7 kg)         Can try colchicine 2 tabs with onset of gout symptoms then 1 tab daily after  If you have any muscle pain while taking this med with rosuvastatin please stop the colchicine and let me know  Try tart cherries, can find mixed cherries in the frozen section - good natural antiinflammatory   Gout  Gout is painful swelling of your joints. Gout is a type of arthritis. It is caused by having too much uric acid in your body. Uric acid is a chemical that is made when your body breaks down substances called purines. If your body has too much uric acid, sharp crystals can form and build up in your joints. Thiscauses pain and swelling. Gout attacks can happen quickly and be very painful (acute gout). Over time, the attacks can affect more joints and happen more often (chronic gout). What are the causes? Too much uric acid in your blood. This can happen because: Your kidneys do not remove enough uric acid from your blood. Your body makes too much uric acid. You eat too many foods that are high in purines. These foods include organ meats, some seafood, and beer. Trauma or stress. What increases the risk? Having a family history of gout. Being male and middle-aged. Being male and having gone through menopause. Being very overweight (obese). Drinking alcohol, especially beer. Not having enough water in the body (being dehydrated). Losing weight too quickly. Having an organ transplant. Having lead poisoning. Taking certain medicines. Having kidney disease. Having a skin condition called psoriasis. What are the signs or symptoms? An attack of acute gout usually happens in just one joint. The most common place is the big toe. Attacks often start at night. Other joints that may be  affected include joints of the feet, ankle, knee, fingers, wrist, or elbow. Symptoms of an attack may include: Very bad pain. Warmth. Swelling. Stiffness. Shiny, red, or purple skin. Tenderness. The affected joint may be very painful to touch. Chills and fever. Chronic gout may cause symptoms more often. More joints may be involved. You may also have white or yellow lumps (tophi) on your hands or feet or in other areas near your joints. How is this treated? Treatment for this condition has two phases: treating an acute attack and preventing future attacks. Acute gout treatment may include: NSAIDs. Steroids. These are taken by mouth or injected into a joint. Colchicine. This medicine relieves pain and swelling. It can be given by mouth or through an IV tube. Preventive treatment may include: Taking small doses of NSAIDs or colchicine daily. Using a medicine that reduces uric acid levels in your blood. Making changes to your diet. You may need to see a food expert (dietitian) about what to eat and drink to prevent gout. Follow these instructions at home: During a gout attack  If told, put ice on the painful area: Put ice in a plastic bag. Place a towel between your skin and the bag. Leave the ice on for 20 minutes, 2-3 times a day. Raise (elevate) the painful joint above the level of your heart as often as you can. Rest the joint as much  as possible. If the joint is in your leg, you may be given crutches. Follow instructions from your doctor about what you cannot eat or drink.  Avoiding future gout attacks Eat a low-purine diet. Avoid foods and drinks such as: Liver. Kidney. Anchovies. Asparagus. Herring. Mushrooms. Mussels. Beer. Stay at a healthy weight. If you want to lose weight, talk with your doctor. Do not lose weight too fast. Start or continue an exercise plan as told by your doctor. Eating and drinking Drink enough fluids to keep your pee (urine) pale yellow. If  you drink alcohol: Limit how much you use to: 0-1 drink a day for women. 0-2 drinks a day for men. Be aware of how much alcohol is in your drink. In the [place] one drink equals one 12 oz bottle of beer (355 mL), one 5 oz glass of wine (148 mL), or one 1 oz glass of hard liquor (44 mL). General instructions Take over-the-counter and prescription medicines only as told by your doctor. Do not drive or use heavy machinery while taking prescription pain medicine. Return to your normal activities as told by your doctor. Ask your doctor what activities are safe for you. Keep all follow-up visits as told by your doctor. This is important. Contact a doctor if: You have another gout attack. You still have symptoms of a gout attack after 10 days of treatment. You have problems (side effects) because of your medicines. You have chills or a fever. You have burning pain when you pee (urinate). You have pain in your lower back or belly. Get help right away if: You have very bad pain. Your pain cannot be controlled. You cannot pee. Summary Gout is painful swelling of the joints. The most common site of pain is the big toe, but it can affect other joints. Medicines and avoiding some foods can help to prevent and treat gout attacks. This information is not intended to replace advice given to you by your health care provider. Make sure you discuss any questions you have with your healthcare provider. Document Revised: 01/19/2018 Document Reviewed: 01/19/2018 Elsevier Patient Education  2022 [employer].

## 2021-02-21 ENCOUNTER — Encounter: Payer: Self-pay | Admitting: Adult Health

## 2021-02-21 DIAGNOSIS — M109 Gout, unspecified: Secondary | ICD-10-CM | POA: Insufficient documentation

## 2021-02-21 LAB — INSULIN, RANDOM: Insulin: 19.1 u[IU]/mL

## 2021-02-21 LAB — CBC WITH DIFFERENTIAL/PLATELET
Absolute Monocytes: 540 cells/uL (ref 200–950)
Basophils Absolute: 33 cells/uL (ref 0–200)
Basophils Relative: 0.5 %
Eosinophils Absolute: 169 cells/uL (ref 15–500)
Eosinophils Relative: 2.6 %
HCT: 46.9 % (ref 38.5–50.0)
Hemoglobin: 15.6 g/dL (ref 13.2–17.1)
Lymphs Abs: 1229 cells/uL (ref 850–3900)
MCH: 33 pg (ref 27.0–33.0)
MCHC: 33.3 g/dL (ref 32.0–36.0)
MCV: 99.2 fL (ref 80.0–100.0)
MPV: 10.8 fL (ref 7.5–12.5)
Monocytes Relative: 8.3 %
Neutro Abs: 4531 cells/uL (ref 1500–7800)
Neutrophils Relative %: 69.7 %
Platelets: 238 10*3/uL (ref 140–400)
RBC: 4.73 10*6/uL (ref 4.20–5.80)
RDW: 13.1 % (ref 11.0–15.0)
Total Lymphocyte: 18.9 %
WBC: 6.5 10*3/uL (ref 3.8–10.8)

## 2021-02-21 LAB — COMPLETE METABOLIC PANEL WITH GFR
AG Ratio: 1.9 (calc) (ref 1.0–2.5)
ALT: 47 U/L — ABNORMAL HIGH (ref 9–46)
AST: 29 U/L (ref 10–35)
Albumin: 4.7 g/dL (ref 3.6–5.1)
Alkaline phosphatase (APISO): 104 U/L (ref 35–144)
BUN: 17 mg/dL (ref 7–25)
CO2: 31 mmol/L (ref 20–32)
Calcium: 10.1 mg/dL (ref 8.6–10.3)
Chloride: 105 mmol/L (ref 98–110)
Creat: 1.1 mg/dL (ref 0.70–1.30)
Globulin: 2.5 g/dL (calc) (ref 1.9–3.7)
Glucose, Bld: 92 mg/dL (ref 65–99)
Potassium: 4.7 mmol/L (ref 3.5–5.3)
Sodium: 144 mmol/L (ref 135–146)
Total Bilirubin: 0.6 mg/dL (ref 0.2–1.2)
Total Protein: 7.2 g/dL (ref 6.1–8.1)
eGFR: 82 mL/min/{1.73_m2} (ref >=60)

## 2021-02-21 LAB — LIPID PANEL
Cholesterol: 222 mg/dL — ABNORMAL HIGH (ref <200)
HDL: 47 mg/dL (ref >=40)
LDL Cholesterol (Calc): 131 mg/dL (calc) — ABNORMAL HIGH
Non-HDL Cholesterol (Calc): 175 mg/dL (calc) — ABNORMAL HIGH (ref <130)
Total CHOL/HDL Ratio: 4.7 (calc) (ref <5.0)
Triglycerides: 307 mg/dL — ABNORMAL HIGH (ref <150)

## 2021-02-21 LAB — URIC ACID: Uric Acid, Serum: 9 mg/dL — ABNORMAL HIGH (ref 4.0–8.0)

## 2021-08-06 ENCOUNTER — Encounter: Payer: Self-pay | Admitting: Adult Health

## 2021-08-06 ENCOUNTER — Ambulatory Visit (INDEPENDENT_AMBULATORY_CARE_PROVIDER_SITE_OTHER): Payer: BC Managed Care – PPO | Admitting: Adult Health

## 2021-08-06 ENCOUNTER — Other Ambulatory Visit: Payer: Self-pay

## 2021-08-06 VITALS — BP 134/88 | HR 91 | Temp 97.7°F | Wt 202.8 lb

## 2021-08-06 DIAGNOSIS — Z8719 Personal history of other diseases of the digestive system: Secondary | ICD-10-CM

## 2021-08-06 DIAGNOSIS — R1084 Generalized abdominal pain: Secondary | ICD-10-CM | POA: Diagnosis not present

## 2021-08-06 DIAGNOSIS — R197 Diarrhea, unspecified: Secondary | ICD-10-CM

## 2021-08-06 MED ORDER — OMEPRAZOLE 40 MG PO CPDR: DELAYED_RELEASE_CAPSULE | ORAL | 0 refills | Status: DC

## 2021-08-06 NOTE — Patient Instructions (Addendum)
Continue to stay off of alcohol  Do plenty of fluids - can alternate broth and water or Gatorade  Do stool sample test IF not improving  Get on gas X and start omeprazole daily before first meal of the day for 2 weeks  Slowly try to reintroduce foods, bland, low fiber first - see below   If any worsening, not getting better or changes please le me know   Parke Simmers Diet A bland diet consists of foods that are often soft and do not have a lot of fat, fiber, or extra seasonings. Foods without fat, fiber, or seasoning are easier for the body to digest. They are also less likely to irritate your mouth, throat, stomach, and other parts of your digestive system. A bland diet is sometimes called a BRAT diet. What is my plan? Your health care provider or food and nutrition specialist (dietitian) may recommend specific changes to your diet to prevent symptoms or to treat your symptoms. These changes may include: Eating small meals often. Cooking food until it is soft enough to chew easily. Chewing your food well. Drinking fluids slowly. Not eating foods that are very spicy, sour, or fatty. Not eating citrus fruits, such as oranges and grapefruit. What do I need to know about this diet? Eat a variety of foods from the bland diet food list. Do not follow a bland diet longer than needed. Ask your health care provider whether you should take vitamins or supplements. What foods can I eat? Grains Hot cereals, such as cream of wheat. Rice. Bread, crackers, or tortillas made from refined white flour. Vegetables Canned or cooked vegetables. Mashed or boiled potatoes. Fruits Bananas. Applesauce. Other types of cooked or canned fruit with the skin and seeds removed, such as canned peaches or pears. Meats and other proteins Scrambled eggs. Creamy peanut butter or other nut butters. Lean, well-cooked meats, such as chicken or fish. Tofu. Soups or broths. Dairy Low-fat dairy products, such as milk, cottage  cheese, or yogurt. Beverages Water. Herbal tea. Apple juice. Fats and oils Mild salad dressings. Canola or olive oil. Sweets and desserts Pudding. Custard. Fruit gelatin. Ice cream. The items listed above may not be a complete list of recommended foods and beverages. Contact a dietitian for more options. What foods are not recommended? Grains Whole grain breads and cereals. Vegetables Raw vegetables. Fruits Raw fruits, especially citrus, berries, or dried fruits. Dairy Whole fat dairy foods. Beverages Caffeinated drinks. Alcohol. Seasonings and condiments Strongly flavored seasonings or condiments. Hot sauce. Salsa. Other foods Spicy foods. Fried foods. Sour foods, such as pickled or fermented foods. Foods with high sugar content. Foods high in fiber. The items listed above may not be a complete list of foods and beverages to avoid. Contact a dietitian for more information. Summary A bland diet consists of foods that are often soft and do not have a lot of fat, fiber, or extra seasonings. Foods without fat, fiber, or seasoning are easier for the body to digest. Check with your health care provider to see how long you should follow this diet plan. It is not meant to be followed for long periods. This information is not intended to replace advice given to you by your health care provider. Make sure you discuss any questions you have with your health care provider. Document Revised: 07/28/2017 Document Reviewed: 07/28/2017 Elsevier Patient Education  2022 [employer].

## 2021-08-06 NOTE — Progress Notes (Signed)
Assessment and Plan:  Jason Curry was seen today for abdominal pain and diarrhea.  Diagnoses and all orders for this visit:  Generalized abdominal pain Variable, episodic, brief, ? A viral gastroenteritis with some gas pains However with his alcohol hx will check upper abdominal labs including lipase  Encouraged to continue avoiding alcohol  If labs neg  -slowly reintroduce solids, BRAT diet reviewed and given  Will add 2 weeks PPI if labs negative, also simethicone product No indication for imaging at this time Stool specimen if not improving, only if persistent liquid stools Follow up if persistent or new/worsening Please go to the ER if you have any severe AB pain, unable to hold down food/water, blood in stool or vomit, chest pain, shortness of breath, or any worsening symptoms.  -     CBC with Differential/Platelet -     COMPLETE METABOLIC PANEL WITH GFR -     Urinalysis, Routine w reflex microscopic -     Lipase  Diarrhea of presumed infectious origin -     Gastrointestinal Pathogen Panel PCR  Other orders -     omeprazole (PRILOSEC) 40 MG capsule; Take 1 cap daily prior to first meal of the day for 2 weeks.  Further disposition pending results of labs. Discussed med's effects and SE's.   Over 30 minutes of exam, counseling, chart review, and critical decision making was performed.   Future Appointments  Date Time Provider [place]  08/06/2021  4:00 PM Liane Comber, NP [facility] None  09/01/2021  3:00 PM Unk Pinto, MD [facility] None    ------------------------------------------------------------------------------------------------------------------   HPI BP (!) 124/110    Pulse 91    Temp 97.7 F (36.5 C)    Wt 202 lb 12.8 oz (92 kg)    SpO2 99%    BMI 32.73 kg/m  50 y.o.male with hx of diverticulitis/microperforation in 2020 presents for evaluation of 1 week of variable abdominal pain episodes and persistent loose stools/diarrhea.   He reports sx began  about 1 week ago, with dull lower abdominal pain, initially across lower abdomen, intermittent, then started moving around, intermittently upper abdomen, flanks, etc. Generally very brief, a few seconds then will resolve. Movement helps, having BM helps.  He reports when having BMs, 3 per day similar to baseline, mostly diarrhea (was bristol 7, last 2-3 improved to bristol 6).  Has noted increased gas. Denies n/v. Denies reflux/burning, fever/chills, denies blood, black stools, mucus, greasy stools.   He reports over the last few days doing more of a liquid diet, chicken/beef broth, feels sx are improving some.  Has cut out alcohol - cut out last 3-4/day to none in the last 3 days  Denies recent travel, new foods. Wife lives with him and doesn't have any sx.   Denies dyspnea, no exertional, exercise and movement actually helps.   Past Medical History:  Diagnosis Date   Diverticulitis 01/22/2019   Hematuria    Hyperlipidemia    Hypertension    Hypogonadism male    Testosterone deficiency 06/13/2018     No Known Allergies  Current Outpatient Medications on File Prior to Visit  Medication Sig   Ascorbic Acid (VITA-C PO) Take 1 tablet by mouth daily.    aspirin 81 MG tablet Take 81 mg by mouth at bedtime.    Cholecalciferol (VITAMIN D) 2000 units CAPS Take 5 capsules by mouth daily.    colchicine 0.6 MG tablet Take 2 tabs with onset of gout flare, then 1 tab daily as needed.  Cyanocobalamin (VITAMIN B-12 SL) Place 1 tablet under the tongue at bedtime.    Magnesium 200 MG TABS Take 1 tablet by mouth at bedtime.    rosuvastatin (CRESTOR) 20 MG tablet TAKES 1/2 TO 1 TABLET DAILY FOR CHOLESTEROL   zinc gluconate 50 MG tablet Take 50 mg by mouth at bedtime.   ibuprofen (ADVIL) 200 MG tablet Take 400 mg by mouth daily as needed for headache. (Patient not taking: Reported on 08/06/2021)   No current facility-administered medications on file prior to visit.   Surgical History:  He  has no  past surgical history on file. Family History:  Hisfamily history includes Cancer in his mother; Heart disease in his father; Hyperlipidemia in his father; Hypertension in his father. Social History:   reports that he quit smoking about 26 years ago. His smoking use included cigarettes. He started smoking about 33 years ago. He has a 1.75 pack-year smoking history. His smokeless tobacco use includes chew. He reports current alcohol use of about 1.0 standard drink per week. He reports that he does not use drugs.   ROS: all negative except above.   Physical Exam:  BP (!) 124/110    Pulse 91    Temp 97.7 F (36.5 C)    Wt 202 lb 12.8 oz (92 kg)    SpO2 99%    BMI 32.73 kg/m   General Appearance: Well nourished, in no apparent distress. Eyes: PERRLA, conjunctiva no swelling or erythema ENT/Mouth: No erythema, swelling, or exudate on post pharynx.  Tonsils not swollen or erythematous. Hearing normal.  Neck: Supple, thyroid normal.  Respiratory: Respiratory effort normal, BS equal bilaterally without rales, rhonchi, wheezing or stridor.  Cardio: RRR with no MRGs. Brisk peripheral pulses without edema.  Abdomen: Soft, + BS.  Non distended, non-tender, no guarding, rebound, hernias, masses. Lymphatics: Non tender without lymphadenopathy.  Musculoskeletal: normal gait.  Skin: Warm, dry without rashes, lesions, ecchymosis.  Neuro:  Normal muscle tone Psych: Awake and oriented X 3, normal affect, Insight and Judgment appropriate.     Izora Ribas, NP 3:55 PM [facility]

## 2021-08-07 LAB — CBC WITH DIFFERENTIAL/PLATELET
Absolute Monocytes: 625 cells/uL (ref 200–950)
Basophils Absolute: 28 cells/uL (ref 0–200)
Basophils Relative: 0.4 %
Eosinophils Absolute: 57 cells/uL (ref 15–500)
Eosinophils Relative: 0.8 %
HCT: 46.1 % (ref 38.5–50.0)
Hemoglobin: 16.2 g/dL (ref 13.2–17.1)
Lymphs Abs: 1448 cells/uL (ref 850–3900)
MCH: 33.5 pg — ABNORMAL HIGH (ref 27.0–33.0)
MCHC: 35.1 g/dL (ref 32.0–36.0)
MCV: 95.4 fL (ref 80.0–100.0)
MPV: 10.7 fL (ref 7.5–12.5)
Monocytes Relative: 8.8 %
Neutro Abs: 4942 cells/uL (ref 1500–7800)
Neutrophils Relative %: 69.6 %
Platelets: 207 10*3/uL (ref 140–400)
RBC: 4.83 10*6/uL (ref 4.20–5.80)
RDW: 13.3 % (ref 11.0–15.0)
Total Lymphocyte: 20.4 %
WBC: 7.1 10*3/uL (ref 3.8–10.8)

## 2021-08-07 LAB — COMPLETE METABOLIC PANEL WITH GFR
AG Ratio: 1.8 (calc) (ref 1.0–2.5)
ALT: 26 U/L (ref 9–46)
AST: 26 U/L (ref 10–35)
Albumin: 4.8 g/dL (ref 3.6–5.1)
Alkaline phosphatase (APISO): 96 U/L (ref 35–144)
BUN: 14 mg/dL (ref 7–25)
CO2: 29 mmol/L (ref 20–32)
Calcium: 9.7 mg/dL (ref 8.6–10.3)
Chloride: 103 mmol/L (ref 98–110)
Creat: 0.79 mg/dL (ref 0.70–1.30)
Globulin: 2.7 g/dL (calc) (ref 1.9–3.7)
Glucose, Bld: 73 mg/dL (ref 65–99)
Potassium: 4.3 mmol/L (ref 3.5–5.3)
Sodium: 140 mmol/L (ref 135–146)
Total Bilirubin: 0.9 mg/dL (ref 0.2–1.2)
Total Protein: 7.5 g/dL (ref 6.1–8.1)
eGFR: 108 mL/min/{1.73_m2} (ref >=60)

## 2021-08-07 LAB — URINALYSIS, ROUTINE W REFLEX MICROSCOPIC
Bacteria, UA: NONE SEEN /HPF
Bilirubin Urine: NEGATIVE
Glucose, UA: NEGATIVE
Hyaline Cast: NONE SEEN /LPF
Leukocytes,Ua: NEGATIVE
Nitrite: NEGATIVE
Specific Gravity, Urine: 1.023 (ref 1.001–1.035)
Squamous Epithelial / HPF: NONE SEEN /HPF (ref <=5)
WBC, UA: NONE SEEN /HPF (ref 0–5)
pH: 5.5 (ref 5.0–8.0)

## 2021-08-07 LAB — LIPASE: Lipase: 10 U/L (ref 7–60)

## 2021-09-01 ENCOUNTER — Encounter: Payer: BC Managed Care – PPO | Admitting: Internal Medicine

## 2021-11-18 ENCOUNTER — Encounter: Payer: Self-pay | Admitting: Internal Medicine

## 2021-11-18 NOTE — Patient Instructions (Signed)
Due to recent changes in healthcare laws, you may see the results of your imaging and laboratory studies on MyChart before your provider has had a chance to review them.  We understand that in some cases there may be results that are confusing or concerning to you. Not all laboratory results come back in the same time frame and the provider may be waiting for multiple results in order to interpret others.  Please give us 48 hours in order for your provider to thoroughly review all the results before contacting the office for clarification of your results.  ? ?++++++++++++++++++++++++++++++++++++++ ? Vit D  & ?Vit C 1,000 mg   ?are recommended to help protect  ?against the Covid-19 and other Corona viruses.  ? ? Also it's recommended  ?to take  ?Zinc 50 mg  ?to help  ?protect against the Covid-19   ?and best place to get ? is also on [employer]  ?and don't pay more than 6-8 cents /pill !  ?=============================== ?Coronavirus (COVID-19) Are you at risk? ? ?Are you at risk for the Coronavirus (COVID-19)? ? ?To be considered HIGH RISK for Coronavirus (COVID-19), you have to meet the following criteria: ? ?Traveled to [place], [place], [place], [place] or [place]; or in the [place] to [place], [place], [place]  ?or [place]; and have fever, cough, and shortness of breath within the last 2 weeks of travel OR ?Been in close contact with a person diagnosed with COVID-19 within the last 2 weeks and have  ?fever, cough,and shortness of breath ? ?IF YOU DO NOT MEET THESE CRITERIA, YOU ARE CONSIDERED LOW RISK FOR COVID-19. ? ?What to do if you are HIGH RISK for COVID-19? ? ?If you are having a medical emergency, call 911. ?Seek medical care right away. Before you go to a doctor?s office, urgent care or emergency department, ? call ahead and tell them about your recent travel, contact with someone diagnosed with COVID-19  ? and your symptoms.  ?You should receive instructions from your physician?s office  regarding next steps of care.  ?When you arrive at healthcare provider, tell the healthcare staff immediately you have returned from  ?visiting [place], [place], [place], [place] or [place]; or traveled in the [place] to [place], [place],  ?[place] or [place] in the last two weeks or you have been in close contact with a person diagnosed with  ?COVID-19 in the last 2 weeks.   ?Tell the health care staff about your symptoms: fever, cough and shortness of breath. ?After you have been seen by a medical provider, you will be either: ?Tested for (COVID-19) and discharged home on quarantine except to seek medical care if  ?symptoms worsen, and asked to  ?Stay home and avoid contact with others until you get your results (4-5 days)  ?Avoid travel on public transportation if possible (such as bus, train, or airplane) or ?Sent to the Emergency Department by EMS for evaluation, COVID-19 testing  and  ?possible admission depending on your condition and test results. ? ?What to do if you are LOW RISK for COVID-19? ? ?Reduce your risk of any infection by using the same precautions used for avoiding the common cold or flu:  ?Wash your hands often with soap and warm water for at least 20 seconds.  If soap and water are not readily available,  ?use an alcohol-based hand sanitizer with at least 60% alcohol.  ?If coughing or sneezing, cover your mouth and nose by coughing   or sneezing into the elbow areas of your shirt or coat, ? into a tissue or into your sleeve (not your hands). ?Avoid shaking hands with others and consider head nods or verbal greetings only. ?Avoid touching your eyes, nose, or mouth with unwashed hands.  ?Avoid close contact with people who are sick. ?Avoid places or events with large numbers of people in one location, like concerts or sporting events. ?Carefully consider travel plans you have or are making. ?If you are planning any travel outside or inside the [place], visit the CDC?s Travelers? Health  webpage for the latest health notices. ?If you have some symptoms but not all symptoms, continue to monitor at home and seek medical attention  ?if your symptoms worsen. ?If you are having a medical emergency, call 911. ?>>>>>>>>>>>>>>>>>>>>>>>>>>>> ?Preventive Care for Adults ? ?A healthy lifestyle and preventive care can promote health and wellness. Preventive health guidelines for men include the following key practices: ?A routine yearly physical is a good way to check with your health care provider about your health and preventative screening. It is a chance to share any concerns and updates on your health and to receive a thorough exam. ?Visit your dentist for a routine exam and preventative care every 6 months. Brush your teeth twice a day and floss once a day. Good oral hygiene prevents tooth decay and gum disease. ?The frequency of eye exams is based on your age, health, family medical history, use of contact lenses, and other factors. Follow your health care provider's recommendations for frequency of eye exams. ?Eat a healthy diet. Foods such as vegetables, fruits, whole grains, low-fat dairy products, and lean protein foods contain the nutrients you need without too many calories. Decrease your intake of foods high in solid fats, added sugars, and salt. Eat the right amount of calories for you. Get information about a proper diet from your health care provider, if necessary. ?Regular physical exercise is one of the most important things you can do for your health. Most adults should get at least 150 minutes of moderate-intensity exercise (any activity that increases your heart rate and causes you to sweat) each week. In addition, most adults need muscle-strengthening exercises on 2 or more days a week. ?Maintain a healthy weight. The body mass index (BMI) is a screening tool to identify possible weight problems. It provides an estimate of body fat based on height and weight. Your health care provider can  find your BMI and can help you achieve or maintain a healthy weight. For adults 20 years and older: ?A BMI below 18.5 is considered underweight. ?A BMI of 18.5 to 24.9 is normal. ?A BMI of 25 to 29.9 is considered overweight. ?A BMI of 30 and above is considered obese. ?Maintain normal blood lipids and cholesterol levels by exercising and minimizing your intake of saturated fat. Eat a balanced diet with plenty of fruit and vegetables. Blood tests for lipids and cholesterol should begin at age 20 and be repeated every 5 years. If your lipid or cholesterol levels are high, you are over 50, or you are at high risk for heart disease, you may need your cholesterol levels checked more frequently. Ongoing high lipid and cholesterol levels should be treated with medicines if diet and exercise are not working. ?If you smoke, find out from your health care provider how to quit. If you do not use tobacco, do not start. ?Lung cancer screening is recommended for adults aged 55-80 years who are at high risk for   developing lung cancer because of a history of smoking. A yearly low-dose CT scan of the lungs is recommended for people who have at least a 30-pack-year history of smoking and are a current smoker or have quit within the past 15 years. A pack year of smoking is smoking an average of 1 pack of cigarettes a day for 1 year (for example: 1 pack a day for 30 years or 2 packs a day for 15 years). Yearly screening should continue until the smoker has stopped smoking for at least 15 years. Yearly screening should be stopped for people who develop a health problem that would prevent them from having lung cancer treatment. ?If you choose to drink alcohol, do not have more than 2 drinks per day. One drink is considered to be 12 ounces (355 mL) of beer, 5 ounces (148 mL) of wine, or 1.5 ounces (44 mL) of liquor. ?Avoid use of street drugs. Do not share needles with anyone. Ask for help if you need support or instructions about  stopping the use of drugs. ?High blood pressure causes heart disease and increases the risk of stroke. Your blood pressure should be checked at least every 1-2 years. Ongoing high blood pressure should be treated

## 2021-11-18 NOTE — Progress Notes (Signed)
?  ?Annual  Screening/Preventative Visit  ?& Comprehensive Evaluation & Examination ?   ?Future Appointments  ?Date Time Provider Department  ?11/19/2021  3:00 PM Lucky CowboyMcKeown, Ripley Bogosian, MD [facility]  ? ? ?                                        This very nice 51 y.o. MWM presents for a Screening /Preventative Visit & comprehensive evaluation and management of multiple medical co-morbidities.  Patient has been followed for HTN, HLD, Prediabetes and Vitamin D Deficiency. ?  ?  ?                                          Patient has remote hx/o elevated BP and has been followed since 2010 for labile HTN.   Patient's BP has been controlled and today's BP is 124/88.  Patient denies any cardiac symptoms as chest pain, palpitations, shortness of breath, dizziness or ankle swelling. ?  ?  ?                                          Patient's hyperlipidemia is not controlled with diet and Rosuvastatin. Patient denies myalgias or other medication SE's. Last lipids were not at goal : ? ?Lab Results  ?Component Value Date  ? CHOL 222 (H) 02/20/2021  ? HDL 47 02/20/2021  ? LDLCALC 131 (H) 02/20/2021  ? TRIG 307 (H) 02/20/2021  ? CHOLHDL 4.7 02/20/2021  ? ?                                          Patient is moderately overweight (BMI 32.7+) and is monitored expectantly for glucose intolerance and patient denies reactive hypoglycemic symptoms, visual blurring, diabetic polys or paresthesias. Last A1c was normal & at goal: ?  ?     ?Lab Results  ?Component Value Date  ?  HGBA1C 5.0 08/21/2019  ?  ?  ?                                          The patient has history of Vitamin D Deficiency ("27" /2008 - "32" /2017" & "31" /2018) and last vitamin D was still low: ?  ?Lab Results  ?Component Value Date  ? VD25OH 39 08/22/2020  ?   ? ?    ?Current Outpatient Medications on File Prior to Visit  ?Medication Sig  ? VITA-C Take 1 tablet daily.   ? aspirin 81 MG tablet Take  at bedtime.   ? VITAMIN D 2000 units  Take 5 capsules (10,000 u) daily.    ? VITAMIN B-12 SL Place 1 tablet under the tongue at bedtime.   ? ibuprofen  200 MG tablet Take 400 mg  daily as needed for headache.  ? Magnesium 200 MG TABS Take 1 tablet  at bedtime.   ? rosuvastatin 40 MG tablet TAKES 1/2 TO 1 TABLET DAILY   ? Zinc 50 MG tablet Take at bedtime.  ?  ?  ?  No Known Allergies ?  ?    ?Past Medical History:  ?Diagnosis Date  ? Hematuria    ? Hyperlipidemia    ? Hypertension    ? Hypogonadism male    ?  ?    ?Health Maintenance  ?Topic Date Due  ? Hepatitis C Screening  Never done  ? INFLUENZA VACCINE  02/11/2020  ? COVID-19 Vaccine (3 - Booster for Pfizer series) 04/22/2020  ? TETANUS/TDAP  05/04/2027  ? HIV Screening  Completed  ?  ?    ?Immunization History  ?Administered Date(s) Administered  ? Influenza-Unspecified 05/04/2018  ? PFIZERSARS-COV-2 Vacc 09/24/2019, 10/22/2019  ? PPD Test 05/03/2017, 06/13/2018, 08/21/2019  ? Pneumococcal -23 11/29/2008  ? Td 05/03/2017  ? Tdap 09/16/2006  ?  ?  ?No past surgical history on file.  ?  ? ?     ?Family History  ?Problem Relation Age of Onset  ? Cancer Mother    ?      thyroid  ? Heart disease Father    ? Hypertension Father    ? Hyperlipidemia Father    ?  ?Social History  ?  ?     ?Socioeconomic History  ? Marital status: Married  ?    Spouse name: Not on file  ? Number of children: Not on file  ?Occupational History  ?    ?Tobacco Use  ? Smoking status: Former Smoker  ?    Quit date: 06/13/1993  ?    Years since quitting: 27.2  ? Smokeless tobacco: Current User  ?    Types: Chew  ?Substance and Sexual Activity  ? Alcohol use: Yes  ?    Alcohol/week: 1.0 standard drink  ?    Types: 1 Standard drinks or equivalent per week  ? Drug use: No  ? Sexual activity: Not on file  ?  ? ROS ?Constitutional: Denies fever, chills, weight loss/gain, headaches, insomnia,  night sweats or change in appetite. Does c/o fatigue. ?Eyes: Denies redness, blurred vision, diplopia, discharge, itchy or watery eyes.  ?ENT: Denies discharge, congestion, post  nasal drip, epistaxis, sore throat, earache, hearing loss, dental pain, Tinnitus, Vertigo, Sinus pain or snoring.  ?Cardio: Denies chest pain, palpitations, irregular heartbeat, syncope, dyspnea, diaphoresis, orthopnea, PND, claudication or edema ?Respiratory: denies cough, dyspnea, DOE, pleurisy, hoarseness, laryngitis or wheezing.  ?Gastrointestinal: Denies dysphagia, heartburn, reflux, water brash, pain, cramps, nausea, vomiting, bloating, diarrhea, constipation, hematemesis, melena, hematochezia, jaundice or hemorrhoids ?Genito.urinary:  Has urgency, nocturia x 0-1 n& occasional hesitancy. Denies dysuria, frequency, discharge, hematuria or flank pain. ?Musculoskeletal: Denies arthralgia, myalgia, stiffness, Jt. Swelling, pain, limp or strain/sprain. Denies Falls. ?Skin: Denies puritis, rash, hives, warts, acne, eczema or change in skin lesion ?Neuro: No weakness, tremor, incoordination, spasms, paresthesia or pain ?Psychiatric: Denies confusion, memory loss or sensory loss. Denies Depression. ?Endocrine: Denies change in weight, skin, hair change, nocturia, and paresthesia, diabetic polys, visual blurring or hyper / hypo glycemic episodes.  ?Heme/Lymph: No excessive bleeding, bruising or enlarged lymph nodes. ?  ?Physical Exam ?  ?BP 124/88   Pulse 95   Temp (!) 97.2 ?F (36.2 ?C)   Resp 16   Ht 5\' 6"  (1.676 m)   Wt 209 lb 3.2 oz (94.9 kg)   SpO2 97%   BMI 33.77 kg/m?  ?  ?General Appearance: Well nourished and well groomed and in no apparent distress. ?  ?Eyes: PERRLA, EOMs, conjunctiva no swelling or erythema, normal fundi and vessels. ?Sinuses: No frontal/maxillary tenderness ?ENT/Mouth: EACs patent /  TMs  nl. Nares clear without erythema, swelling, mucoid exudates. Oral hygiene is good. No erythema, swelling, or exudate. Tongue normal, non-obstructing. Tonsils not swollen or erythematous. Hearing normal.  ?Neck: Supple, thyroid not palpable. No bruits, nodes or JVD. ?Respiratory: Respiratory effort  normal.  BS equal and clear bilateral without rales, rhonci, wheezing or stridor. ?Cardio: Heart sounds are normal with regular rate and rhythm and no murmurs, rubs or gallops. Peripheral pulses are normal and equal bilaterally without edema. No aortic or femoral bruits. ?Chest: symmetric with normal excursions and percussion.  ?Abdomen: Soft, with Nl bowel sounds. Nontender, no guarding, rebound, hernias, masses, or organomegaly.  ?Lymphatics: Non tender without lymphadenopathy.  ?Musculoskeletal: Full ROM all peripheral extremities, joint stability, 5/5 strength, and normal gait. ?Skin: Warm and dry without rashes, lesions, cyanosis, clubbing or  ecchymosis.  ?Neuro: Cranial nerves intact, reflexes equal bilaterally. Normal muscle tone, no cerebellar symptoms. Sensation intact.  ?Pysch: Alert and oriented X 3 with normal affect, insight and judgment appropriate.  ?  ?Assessment and Plan ?  ?1. Annual Preventative/Screening Exam  ?  ?  ?2. Labile hypertension ?  ?- EKG 12-Lead ?- Urinalysis, Routine w reflex microscopic ?- Microalbumin / creatinine urine ratio ?- CBC with Differential/Platelet ?- COMPLETE METABOLIC PANEL WITH GFR ?- Magnesium ?- TSH ?  ?3. Hyperlipidemia, mixed ?  ?- EKG 12-Lead ?- Lipid panel ?- TSH ?  ?4. Abnormal glucose ?  ?- EKG 12-Lead ?- Hemoglobin A1c ?- Insulin, random ?  ?5. Vitamin D deficiency ?  ?- VITAMIN D 25 Hydroxy  ?  ?6. Vitamin B12 deficiency ?  ?  ?7. Testosterone deficiency ?  ?- Testosterone ?  ?8. Screening for colorectal cancer ?  ?  ?9. Screening examination for pulmonary tuberculosis ?  ?- TB Skin Test ?  ?10. BPH with obstruction/lower urinary tract symptoms ?  ?- PSA ?  ?11. Prostate cancer screening ?  ?- PSA ?  ?12. Screening for ischemic heart disease ?  ?- EKG 12-Lead ?  ?13. FHx: heart disease ?  ?- EKG 12-Lead ?  ?14. Former smoker ?  ?- EKG 12-Lead ?  ?15. Fatigue ?  ?- Testosterone ?- CBC with Differential/Platelet ?- TSH ?  ?16. Medication management ?  ?-  Urinalysis, Routine w reflex microscopic ?- Microalbumin / creatinine urine ratio ?- CBC with Differential/Platelet ?- COMPLETE METABOLIC PANEL WITH GFR ?- Magnesium ?- Lipid panel ?- TSH ?- Hemoglobin A1c ?- Insulin,

## 2021-11-19 ENCOUNTER — Encounter: Payer: Self-pay | Admitting: Internal Medicine

## 2021-11-19 ENCOUNTER — Ambulatory Visit (INDEPENDENT_AMBULATORY_CARE_PROVIDER_SITE_OTHER): Payer: BC Managed Care – PPO | Admitting: Internal Medicine

## 2021-11-19 VITALS — BP 130/84 | HR 94 | Temp 97.9°F | Resp 16 | Ht 66.0 in | Wt 178.6 lb

## 2021-11-19 DIAGNOSIS — I10 Essential (primary) hypertension: Secondary | ICD-10-CM

## 2021-11-19 DIAGNOSIS — Z125 Encounter for screening for malignant neoplasm of prostate: Secondary | ICD-10-CM

## 2021-11-19 DIAGNOSIS — Z8249 Family history of ischemic heart disease and other diseases of the circulatory system: Secondary | ICD-10-CM | POA: Diagnosis not present

## 2021-11-19 DIAGNOSIS — Z1329 Encounter for screening for other suspected endocrine disorder: Secondary | ICD-10-CM

## 2021-11-19 DIAGNOSIS — Z13220 Encounter for screening for lipoid disorders: Secondary | ICD-10-CM

## 2021-11-19 DIAGNOSIS — E782 Mixed hyperlipidemia: Secondary | ICD-10-CM

## 2021-11-19 DIAGNOSIS — Z1389 Encounter for screening for other disorder: Secondary | ICD-10-CM

## 2021-11-19 DIAGNOSIS — Z131 Encounter for screening for diabetes mellitus: Secondary | ICD-10-CM

## 2021-11-19 DIAGNOSIS — I700 Atherosclerosis of aorta: Secondary | ICD-10-CM | POA: Diagnosis not present

## 2021-11-19 DIAGNOSIS — R7309 Other abnormal glucose: Secondary | ICD-10-CM

## 2021-11-19 DIAGNOSIS — R5383 Other fatigue: Secondary | ICD-10-CM

## 2021-11-19 DIAGNOSIS — Z111 Encounter for screening for respiratory tuberculosis: Secondary | ICD-10-CM

## 2021-11-19 DIAGNOSIS — Z0000 Encounter for general adult medical examination without abnormal findings: Secondary | ICD-10-CM

## 2021-11-19 DIAGNOSIS — E559 Vitamin D deficiency, unspecified: Secondary | ICD-10-CM | POA: Diagnosis not present

## 2021-11-19 DIAGNOSIS — Z1211 Encounter for screening for malignant neoplasm of colon: Secondary | ICD-10-CM

## 2021-11-19 DIAGNOSIS — Z87891 Personal history of nicotine dependence: Secondary | ICD-10-CM

## 2021-11-19 DIAGNOSIS — Z130 Encounter for screening for diseases of the blood and blood-forming organs and certain disorders involving the immune mechanism: Secondary | ICD-10-CM | POA: Diagnosis not present

## 2021-11-19 DIAGNOSIS — Z79899 Other long term (current) drug therapy: Secondary | ICD-10-CM

## 2021-11-19 DIAGNOSIS — Z0001 Encounter for general adult medical examination with abnormal findings: Secondary | ICD-10-CM

## 2021-11-19 DIAGNOSIS — E349 Endocrine disorder, unspecified: Secondary | ICD-10-CM

## 2021-11-19 DIAGNOSIS — N138 Other obstructive and reflux uropathy: Secondary | ICD-10-CM

## 2021-11-19 DIAGNOSIS — N401 Enlarged prostate with lower urinary tract symptoms: Secondary | ICD-10-CM

## 2021-11-19 DIAGNOSIS — R350 Frequency of micturition: Secondary | ICD-10-CM

## 2021-11-19 DIAGNOSIS — E538 Deficiency of other specified B group vitamins: Secondary | ICD-10-CM

## 2021-11-19 DIAGNOSIS — Z136 Encounter for screening for cardiovascular disorders: Secondary | ICD-10-CM

## 2021-11-19 MED ORDER — DOXYCYCLINE HYCLATE 100 MG PO CAPS: ORAL_CAPSULE | ORAL | 0 refills | Status: DC

## 2021-11-19 MED ORDER — PAROXETINE HCL 20 MG PO TABS: ORAL_TABLET | ORAL | 1 refills | Status: DC

## 2021-11-20 ENCOUNTER — Other Ambulatory Visit: Payer: Self-pay | Admitting: Internal Medicine

## 2021-11-20 LAB — URINALYSIS, ROUTINE W REFLEX MICROSCOPIC
Bilirubin Urine: NEGATIVE
Glucose, UA: NEGATIVE
Hgb urine dipstick: NEGATIVE
Leukocytes,Ua: NEGATIVE
Nitrite: NEGATIVE
Protein, ur: NEGATIVE
Specific Gravity, Urine: 1.017 (ref 1.001–1.035)
pH: 7 (ref 5.0–8.0)

## 2021-11-20 LAB — CBC WITH DIFFERENTIAL/PLATELET
Absolute Monocytes: 598 cells/uL (ref 200–950)
Basophils Absolute: 17 cells/uL (ref 0–200)
Basophils Relative: 0.2 %
Eosinophils Absolute: 83 cells/uL (ref 15–500)
Eosinophils Relative: 1 %
HCT: 47.6 % (ref 38.5–50.0)
Hemoglobin: 16.5 g/dL (ref 13.2–17.1)
Lymphs Abs: 1411 cells/uL (ref 850–3900)
MCH: 34.3 pg — ABNORMAL HIGH (ref 27.0–33.0)
MCHC: 34.7 g/dL (ref 32.0–36.0)
MCV: 99 fL (ref 80.0–100.0)
MPV: 10.3 fL (ref 7.5–12.5)
Monocytes Relative: 7.2 %
Neutro Abs: 6192 cells/uL (ref 1500–7800)
Neutrophils Relative %: 74.6 %
Platelets: 252 10*3/uL (ref 140–400)
RBC: 4.81 10*6/uL (ref 4.20–5.80)
RDW: 13.3 % (ref 11.0–15.0)
Total Lymphocyte: 17 %
WBC: 8.3 10*3/uL (ref 3.8–10.8)

## 2021-11-20 LAB — LIPID PANEL
Cholesterol: 177 mg/dL (ref <200)
HDL: 69 mg/dL (ref >=40)
LDL Cholesterol (Calc): 88 mg/dL (calc)
Non-HDL Cholesterol (Calc): 108 mg/dL (calc) (ref <130)
Total CHOL/HDL Ratio: 2.6 (calc) (ref <5.0)
Triglycerides: 105 mg/dL (ref <150)

## 2021-11-20 LAB — COMPLETE METABOLIC PANEL WITH GFR
AG Ratio: 2 (calc) (ref 1.0–2.5)
ALT: 15 U/L (ref 9–46)
AST: 15 U/L (ref 10–35)
Albumin: 4.7 g/dL (ref 3.6–5.1)
Alkaline phosphatase (APISO): 103 U/L (ref 35–144)
BUN: 14 mg/dL (ref 7–25)
CO2: 27 mmol/L (ref 20–32)
Calcium: 10 mg/dL (ref 8.6–10.3)
Chloride: 103 mmol/L (ref 98–110)
Creat: 0.77 mg/dL (ref 0.70–1.30)
Globulin: 2.4 g/dL (calc) (ref 1.9–3.7)
Glucose, Bld: 84 mg/dL (ref 65–99)
Potassium: 4.2 mmol/L (ref 3.5–5.3)
Sodium: 144 mmol/L (ref 135–146)
Total Bilirubin: 0.7 mg/dL (ref 0.2–1.2)
Total Protein: 7.1 g/dL (ref 6.1–8.1)
eGFR: 109 mL/min/{1.73_m2} (ref >=60)

## 2021-11-20 LAB — HEMOGLOBIN A1C
Hgb A1c MFr Bld: 4.9 % of total Hgb (ref <5.7)
Mean Plasma Glucose: 94 mg/dL
eAG (mmol/L): 5.2 mmol/L

## 2021-11-20 LAB — IRON, TOTAL/TOTAL IRON BINDING CAP
%SAT: 23 % (calc) (ref 20–48)
Iron: 94 ug/dL (ref 50–180)
TIBC: 403 mcg/dL (calc) (ref 250–425)

## 2021-11-20 LAB — TESTOSTERONE: Testosterone: 201 ng/dL — ABNORMAL LOW (ref 250–827)

## 2021-11-20 LAB — INSULIN, RANDOM: Insulin: 9.4 u[IU]/mL

## 2021-11-20 LAB — VITAMIN B12: Vitamin B-12: 521 pg/mL (ref 200–1100)

## 2021-11-20 LAB — MICROALBUMIN / CREATININE URINE RATIO
Creatinine, Urine: 90 mg/dL (ref 20–320)
Microalb Creat Ratio: 6 mcg/mg creat (ref <30)
Microalb, Ur: 0.5 mg/dL

## 2021-11-20 LAB — TSH: TSH: 2.8 mIU/L (ref 0.40–4.50)

## 2021-11-20 LAB — MAGNESIUM: Magnesium: 2.1 mg/dL (ref 1.5–2.5)

## 2021-11-20 LAB — PSA: PSA: 1.56 ng/mL (ref <=4.00)

## 2021-11-20 LAB — VITAMIN D 25 HYDROXY (VIT D DEFICIENCY, FRACTURES): Vit D, 25-Hydroxy: 82 ng/mL (ref 30–100)

## 2021-11-20 NOTE — Progress Notes (Signed)
<><><><><><><><><><><><><><><><><><><><><><><><><><><><><><><><><> ?<><><><><><><><><><><><><><><><><><><><><><><><><><><><><><><><><> ?-   Test results slightly outside the reference range are not unusual. ?If there is anything important, I will review this with you,  ?otherwise it is considered normal test values.  ?If you have further questions,  ?please do not hesitate to contact me at the office or via My Chart.  ?<><><><><><><><><><><><><><><><><><><><><><><><><><><><><><><><><> ?<><><><><><><><><><><><><><><><><><><><><><><><><><><><><><><><><> ? ?-  Testosterone low - Be sure taking your Zinc to help raise Testosterone naturally  ? ? Also daily exercise & Excess weight loss will help raise testosterone levels  ?                                                        at [employer] or [employer] or [employer] ?<><><><><><><><><><><><><><><><><><><><><><><><><><><><><><><><><> ? ?- Iron & Vitamin B12 levels  - both Normal & OK  ?<><><><><><><><><><><><><><><><><><><><><><><><><><><><><><><><><> ? ?-  PSA - Low - Great   ?<><><><><><><><><><><><><><><><><><><><><><><><><><><><><><><><><> ? ?- Lipids Greatly Improved  ? ?- Chol down from 222    to   now 177    ?&  ? Bad LDL Chol down from 131 to now 88 - Both  Excellent  ? ?- Very low risk for Heart Attack  / Stroke ?<><><><><><><><><><><><><><><><><><><><><><><><><><><><><><><><><> ? ?-  A1c - Normal - No Diabetes - Great ! ?<><><><><><><><><><><><><><><><><><><><><><><><><><><><><><><><><> ? ?-  Vitamin D = 82   - Excellent ?<><><><><><><><><><><><><><><><><><><><><><><><><><><><><><><><><> ? ?- All Else - CBC - Kidneys - Electrolytes - Liver - Magnesium & Thyroid   ? ?- all  Normal / OK ?<><><><><><><><><><><><><><><><><><><><><><><><><><><><><><><><><> ? ?- Keep up the [place] Work ! ? ?And   ? ?Do something Special for you & your son every Day ! ?<><><><><><><><><><><><><><><><><><><><><><><><><><><><><><><><><> ? ?- ? ? ? ? ? ? ? ? ? ? ? ? ? ? ? ? ? ? ? ?

## 2021-11-23 ENCOUNTER — Encounter: Payer: Self-pay | Admitting: Internal Medicine

## 2021-12-10 ENCOUNTER — Encounter: Payer: Self-pay | Admitting: Internal Medicine

## 2021-12-11 ENCOUNTER — Ambulatory Visit: Payer: BC Managed Care – PPO | Admitting: Internal Medicine

## 2021-12-11 ENCOUNTER — Encounter: Payer: Self-pay | Admitting: Internal Medicine

## 2021-12-11 VITALS — BP 141/96 | HR 96 | Temp 96.6°F | Ht 66.0 in | Wt 176.8 lb

## 2021-12-11 DIAGNOSIS — R300 Dysuria: Secondary | ICD-10-CM | POA: Diagnosis not present

## 2021-12-11 MED ORDER — METRONIDAZOLE 500 MG PO TABS: ORAL_TABLET | ORAL | 0 refills | Status: DC

## 2021-12-11 NOTE — Progress Notes (Signed)
    Future Appointments  Date Time Provider Department  02/25/2022  9:30 AM Adela Glimpse, NP [facility]  05/28/2022 10:30 AM Lucky Cowboy, MD [facility]  11/25/2022  3:00 PM Lucky Cowboy, MD [facility]    History of Present Illness:                      The patient is a very nice 51 y.o. separated WM with HTN, HLD, Prediabetes and Vitamin D Deficiency who related being recently separated and  presents with c/o "tingling" type dysuria. Denies any  discharge or change in color of urine.     Medications    rosuvastatin 20 MG tablet, TAKES 1/2 TO 1 TABLET DAILY FOR CHOLESTEROL   aspirin 81 MG tablet, Take  at bedtime.    colchicine 0.6 MG tablet,  1 tab daily as needed.   ibuprofen 200 MG tablet, Take 400 mg  daily as needed for headache.   VITAMIN B-12 SL, Place 1 tablet under the tongue at bedtime.    VITA-C , Take 1 tablet by mouth daily.    VITAMIN D 2000 units, Take 5 capsules daily.    doxycycline 100 MG capsule, Take 1 capsule 2 x /day with meals for Infection   Magnesium 200 MG TABS, Take 1 tablet  at bedtime.    metroNIDAZOLE  500 MG tablet, Take 1 tablet 3 x/day after meals for infection   omeprazole  40 MG capsule, Take 1 cap daily prior to first meal of the day for 2 weeks.   PARoxetine  20 MG tablet, Take 1/2 to 1 tablet  Daily  for Mood   zinc gluconate 50 MG tablet, Take  at bedtime.  Problem list  He has Essential hypertension; Hyperlipidemia, mixed; Vitamin D deficiency; Medication management; Obesity (BMI 30.0-34.9); Abnormal glucose; Vitamin B12 deficiency; Former smoker (1.75 pack year hx, quit 1997); FHx: heart disease; Insulin resistance; Chewing tobacco use; Gout; and History of diverticulitis on their problem list.   Observations/Objective:  BP (!) 141/96   Pulse 96   Temp (!) 96.6 F (35.9 C)   Ht 5\' 6"  (1.676 m)   Wt 176 lb 12.8 oz (80.2 kg)   SpO2 99%   BMI 28.54 kg/m   HEENT - WNL. Neck - supple.  Chest - Clear equal BS. Cor - Nl HS.  RRR w/o sig MGR. PP 1(+). No edema. MS- FROM w/o deformities.  Gait Nl. Neuro -  Nl w/o focal abnormalities.  Assessment and Plan:   1. Dysuria   - Urine Culture - Urinalysis, Routine w reflex microscopic  - WET PREP BY MOLECULAR PROBE  - C. trachomatis/N. gonorrhoeae RNA  - metroNIDAZOLE  500 MG tablet;  Take 1 tablet 3 x/day after meals for infection   Dispense: 30 tablet; Refill: 0  Follow Up Instructions:      I discussed the assessment and treatment plan with the patient. The patient was provided an opportunity to ask questions and all were answered. The patient agreed with the plan and demonstrated an understanding of the instructions.       The patient was advised to call back or seek an in-person evaluation if the symptoms worsen or if the condition fails to improve as anticipated.   , MD

## 2021-12-12 LAB — URINALYSIS, ROUTINE W REFLEX MICROSCOPIC
Bilirubin Urine: NEGATIVE
Glucose, UA: NEGATIVE
Hgb urine dipstick: NEGATIVE
Ketones, ur: NEGATIVE
Leukocytes,Ua: NEGATIVE
Nitrite: NEGATIVE
Protein, ur: NEGATIVE
Specific Gravity, Urine: 1.027 (ref 1.001–1.035)
pH: 7 (ref 5.0–8.0)

## 2021-12-12 LAB — C. TRACHOMATIS/N. GONORRHOEAE RNA
C. trachomatis RNA, TMA: NOT DETECTED
N. gonorrhoeae RNA, TMA: NOT DETECTED

## 2021-12-12 LAB — URINE CULTURE
MICRO NUMBER:: 13471402
SPECIMEN QUALITY:: ADEQUATE

## 2021-12-13 ENCOUNTER — Other Ambulatory Visit: Payer: Self-pay | Admitting: Internal Medicine

## 2021-12-13 NOTE — Progress Notes (Signed)
<><><><><><><><><><><><><><><><><><><><><><><><><><><><><><><><><> <><><><><><><><><><><><><><><><><><><><><><><><><><><><><><><><><> -   Test results slightly outside the reference range are not unusual. If there is anything important, I will review this with you,  otherwise it is considered normal test values.  If you have further questions,  please do not hesitate to contact me at the office or via My Chart.  <><><><><><><><><><><><><><><><><><><><><><><><><><><><><><><><><> <><><><><><><><><><><><><><><><><><><><><><><><><><><><><><><><><>  -  U/C Negative infection for Prostate or Bladder infection &   - STD testing for Gonorrhea & Chlamydia  - both Negative  <><><><><><><><><><><><><><><><><><><><><><><><><><><><><><><><><> <><><><><><><><><><><><><><><><><><><><><><><><><><><><><><><><><>

## 2022-02-25 ENCOUNTER — Ambulatory Visit (INDEPENDENT_AMBULATORY_CARE_PROVIDER_SITE_OTHER): Payer: BC Managed Care – PPO | Admitting: Nurse Practitioner

## 2022-02-25 ENCOUNTER — Encounter: Payer: Self-pay | Admitting: Nurse Practitioner

## 2022-02-25 VITALS — BP 130/80 | HR 63 | Temp 98.2°F | Resp 16 | Ht 66.0 in | Wt 180.2 lb

## 2022-02-25 DIAGNOSIS — E669 Obesity, unspecified: Secondary | ICD-10-CM

## 2022-02-25 DIAGNOSIS — E782 Mixed hyperlipidemia: Secondary | ICD-10-CM | POA: Diagnosis not present

## 2022-02-25 DIAGNOSIS — I10 Essential (primary) hypertension: Secondary | ICD-10-CM

## 2022-02-25 DIAGNOSIS — E559 Vitamin D deficiency, unspecified: Secondary | ICD-10-CM

## 2022-02-25 DIAGNOSIS — Z720 Tobacco use: Secondary | ICD-10-CM

## 2022-02-25 DIAGNOSIS — Z79899 Other long term (current) drug therapy: Secondary | ICD-10-CM

## 2022-02-25 NOTE — Progress Notes (Signed)
FOLLOW UP  Assessment and Plan:   Hypertension Discussed DASH (Dietary Approaches to Stop Hypertension) DASH diet is lower in sodium than a typical American diet. Cut back on foods that are high in saturated fat, cholesterol, and trans fats. Eat more whole-grain foods, fish, poultry, and nuts Remain active and exercise as tolerated daily.  Monitor BP at home-Call if greater than 130/80.  Check CMP/CBC   Cholesterol Discussed lifestyle modifications. Recommended diet heavy in fruits and veggies, omega 3's. Decrease consumption of animal meats, cheeses, and dairy products. Remain active and exercise as tolerated. Continue to monitor. Check lipids/TSH   Obesity with co morbidities Long discussion about weight loss, diet, and exercise -  Discussed appropriate BMI Goal of losing 1 lb per month. Diet modification. Physical activity. Encouraged/praised to build confidence.   Vitamin D Def Continue supplement;  Monitor levels  Chewing tobacco use Not ready to quit Tobacco cessation instruction/counseling given:  counseled patient on the dangers of tobacco use, advised patient to stop smoking, and reviewed strategies to maximize success Discussed risks associated with tobacco use and advised to reduce or quit  Medication Management All medications discussed and reviewed in full. All questions and concerns regarding medications addressed.    Orders Placed This Encounter  Procedures   CBC with Differential/Platelet   COMPLETE METABOLIC PANEL WITH GFR   Lipid panel   VITAMIN D 25 Hydroxy (Vit-D Deficiency, Fractures)     Continue diet and meds as discussed. Further disposition pending results of labs. Discussed med's effects and SE's.   Over 20 minutes of exam, counseling, chart review, and critical decision making was performed.   Future Appointments  Date Time Provider Department Center  05/28/2022 10:30 AM Lucky Cowboy, MD [facility] None  11/25/2022  3:00 PM  Lucky Cowboy, MD [facility] None    ----------------------------------------------------------------------------------------------------------------------  HPI 51 y.o. male  presents for 6 month follow up on hypertension, cholesterol, glucose management, obesity, testosterone def and vitamin D deficiency.   Overall he reports doing well today.  He has no additional concerns at this time.  BMI is Body mass index is 29.09 kg/m., he has been working on diet but admits hasn't been taking the time to pack lunch for work. He has cut down on red meat, minimal, trying to do more fish. Eats a lot of beans. Lots of veggies. He admits beer holds him back, none or 6 pack, up to 18 in a weekend. Receptive to reducing.  He does work a physically intense job in Production designer, theatre/television/film.  Wt Readings from Last 3 Encounters:  02/25/22 180 lb 3.2 oz (81.7 kg)  12/11/21 176 lb 12.8 oz (80.2 kg)  11/19/21 178 lb 9.6 oz (81 kg)   Today their BP is BP: 130/80  He does not workout. He denies chest pain, shortness of breath, dizziness.   He is on cholesterol medication - he is on rosuvastatin 20 mg daily, and denies myalgias. His cholesterol is not at goal. The cholesterol last visit was:   Lab Results  Component Value Date   CHOL 177 11/19/2021   HDL 69 11/19/2021   LDLCALC 88 11/19/2021   TRIG 105 11/19/2021   CHOLHDL 2.6 11/19/2021    He has been working on diet and exercise for glucose management, and denies increased appetite, nausea, paresthesia of the feet, polydipsia, polyuria, visual disturbances and vomiting. Last A1C in the office was:  Lab Results  Component Value Date   HGBA1C 4.9 11/19/2021   Patient is on Vitamin D supplement,  taking 85462 IU daily:     Lab Results  Component Value Date   VD25OH 82 11/19/2021     He has testosterone def, he is currently taking zinc 50 mg daily, stopped shots and didn't notice a difference.  Lab Results  Component Value Date   TESTOSTERONE 201 (L) 11/19/2021    He continues on daily B12 supplement, doing well with SL  Lab Results  Component Value Date   VITAMINB12 521 11/19/2021    Current Medications:  Current Outpatient Medications on File Prior to Visit  Medication Sig   Ascorbic Acid (VITA-C PO) Take 1 tablet by mouth daily.    aspirin 81 MG tablet Take 81 mg by mouth at bedtime.    Cholecalciferol (VITAMIN D) 2000 units CAPS Take 5 capsules by mouth daily.    colchicine 0.6 MG tablet Take 2 tabs with onset of gout flare, then 1 tab daily as needed.   Cyanocobalamin (VITAMIN B-12 SL) Place 1 tablet under the tongue at bedtime.    ibuprofen (ADVIL) 200 MG tablet Take 400 mg by mouth daily as needed for headache.   Magnesium 200 MG TABS Take 1 tablet by mouth at bedtime.    metroNIDAZOLE (FLAGYL) 500 MG tablet Take 1 tablet 3 x/day after meals for infection   omeprazole (PRILOSEC) 40 MG capsule Take 1 cap daily prior to first meal of the day for 2 weeks.   rosuvastatin (CRESTOR) 20 MG tablet TAKES 1/2 TO 1 TABLET DAILY FOR CHOLESTEROL   zinc gluconate 50 MG tablet Take 50 mg by mouth at bedtime.   PARoxetine (PAXIL) 20 MG tablet Take 1/2 to 1 tablet  Daily  for Mood (Patient not taking: Reported on 02/25/2022)   No current facility-administered medications on file prior to visit.     Allergies: No Known Allergies   Medical History:  Past Medical History:  Diagnosis Date   Diverticulitis 01/22/2019   Hematuria    Hyperlipidemia    Hypertension    Hypogonadism male    Testosterone deficiency 06/13/2018   Family history- Reviewed and unchanged Social history- Reviewed and unchanged   Review of Systems:  Review of Systems  Constitutional:  Negative for malaise/fatigue and weight loss.  HENT:  Negative for hearing loss and tinnitus.   Eyes:  Negative for blurred vision and double vision.  Respiratory:  Negative for cough, shortness of breath and wheezing.   Cardiovascular:  Negative for chest pain, palpitations, orthopnea,  claudication and leg swelling.  Gastrointestinal:  Negative for abdominal pain, blood in stool, constipation, diarrhea, heartburn, melena, nausea and vomiting.  Genitourinary: Negative.   Musculoskeletal:  Negative for joint pain and myalgias.  Skin:  Negative for rash.  Neurological:  Negative for dizziness, tingling, sensory change, weakness and headaches.  Endo/Heme/Allergies:  Negative for polydipsia.  Psychiatric/Behavioral: Negative.    All other systems reviewed and are negative.     Physical Exam: BP 130/80   Pulse 63   Temp 98.2 F (36.8 C)   Resp 16   Ht 5\' 6"  (1.676 m)   Wt 180 lb 3.2 oz (81.7 kg)   SpO2 98%   BMI 29.09 kg/m  Wt Readings from Last 3 Encounters:  02/25/22 180 lb 3.2 oz (81.7 kg)  12/11/21 176 lb 12.8 oz (80.2 kg)  11/19/21 178 lb 9.6 oz (81 kg)   General Appearance: Well nourished, in no apparent distress. Eyes: PERRLA, EOMs, conjunctiva no swelling or erythema Sinuses: No Frontal/maxillary tenderness ENT/Mouth: Ext aud canals clear, TMs  without erythema, bulging. No erythema, swelling, or exudate on post pharynx.  Tonsils not swollen or erythematous. Hearing normal.  Neck: Supple, thyroid normal.  Respiratory: Respiratory effort normal, BS equal bilaterally without rales, rhonchi, wheezing or stridor.  Cardio: RRR with no MRGs. Brisk peripheral pulses without edema.  Abdomen: Soft, obese abdomen, + BS.  Non tender, no guarding, rebound, hernias, masses. Lymphatics: Non tender without lymphadenopathy.  Musculoskeletal: Full ROM, 5/5 strength, Normal gait. R toe MTP joint with mild bony enlargement and erythema, no heat or bogginess.  Skin: Warm, dry without rashes, lesions, ecchymosis.  Neuro: Cranial nerves intact. No cerebellar symptoms.  Psych: Awake and oriented X 3, normal affect, Insight and Judgment appropriate.    Darrol Jump, NP 9:44 AM [facility]

## 2022-02-25 NOTE — Patient Instructions (Signed)

## 2022-02-26 LAB — CBC WITH DIFFERENTIAL/PLATELET
Absolute Monocytes: 480 cells/uL (ref 200–950)
Basophils Absolute: 30 cells/uL (ref 0–200)
Basophils Relative: 0.5 %
Eosinophils Absolute: 72 cells/uL (ref 15–500)
Eosinophils Relative: 1.2 %
HCT: 48.8 % (ref 38.5–50.0)
Hemoglobin: 16.9 g/dL (ref 13.2–17.1)
Lymphs Abs: 1182 cells/uL (ref 850–3900)
MCH: 34.1 pg — ABNORMAL HIGH (ref 27.0–33.0)
MCHC: 34.6 g/dL (ref 32.0–36.0)
MCV: 98.4 fL (ref 80.0–100.0)
MPV: 10.2 fL (ref 7.5–12.5)
Monocytes Relative: 8 %
Neutro Abs: 4236 cells/uL (ref 1500–7800)
Neutrophils Relative %: 70.6 %
Platelets: 216 10*3/uL (ref 140–400)
RBC: 4.96 10*6/uL (ref 4.20–5.80)
RDW: 12.9 % (ref 11.0–15.0)
Total Lymphocyte: 19.7 %
WBC: 6 10*3/uL (ref 3.8–10.8)

## 2022-02-26 LAB — LIPID PANEL
Cholesterol: 239 mg/dL — ABNORMAL HIGH (ref <200)
HDL: 75 mg/dL (ref >=40)
LDL Cholesterol (Calc): 151 mg/dL (calc) — ABNORMAL HIGH
Non-HDL Cholesterol (Calc): 164 mg/dL (calc) — ABNORMAL HIGH (ref <130)
Total CHOL/HDL Ratio: 3.2 (calc) (ref <5.0)
Triglycerides: 43 mg/dL (ref <150)

## 2022-02-26 LAB — COMPLETE METABOLIC PANEL WITH GFR
AG Ratio: 1.7 (calc) (ref 1.0–2.5)
ALT: 24 U/L (ref 9–46)
AST: 18 U/L (ref 10–35)
Albumin: 4.7 g/dL (ref 3.6–5.1)
Alkaline phosphatase (APISO): 88 U/L (ref 35–144)
BUN: 19 mg/dL (ref 7–25)
CO2: 28 mmol/L (ref 20–32)
Calcium: 9.7 mg/dL (ref 8.6–10.3)
Chloride: 105 mmol/L (ref 98–110)
Creat: 0.87 mg/dL (ref 0.70–1.30)
Globulin: 2.7 g/dL (calc) (ref 1.9–3.7)
Glucose, Bld: 103 mg/dL — ABNORMAL HIGH (ref 65–99)
Potassium: 5 mmol/L (ref 3.5–5.3)
Sodium: 145 mmol/L (ref 135–146)
Total Bilirubin: 0.8 mg/dL (ref 0.2–1.2)
Total Protein: 7.4 g/dL (ref 6.1–8.1)
eGFR: 104 mL/min/{1.73_m2} (ref >=60)

## 2022-02-26 LAB — VITAMIN D 25 HYDROXY (VIT D DEFICIENCY, FRACTURES): Vit D, 25-Hydroxy: 43 ng/mL (ref 30–100)

## 2022-05-28 ENCOUNTER — Ambulatory Visit: Payer: BC Managed Care – PPO | Admitting: Internal Medicine

## 2022-06-23 ENCOUNTER — Ambulatory Visit: Payer: BC Managed Care – PPO | Admitting: Internal Medicine

## 2022-09-02 NOTE — Progress Notes (Signed)
Future Appointments  Date Time Provider Department  09/03/2022 11:30 AM Unk Pinto, MD [facility]  11/25/2022  3:00 PM Unk Pinto, MD [facility]    History of Present Illness:       This very nice 52 y.o. MWM presents for 6 month follow up with HTN, HLD, Pre-Diabetes and Vitamin D Deficiency.         Patient is treated for HTN  since  & BP has been controlled at home. Today's BP is at goal -  138/84. Patient has had no complaints of any cardiac type chest pain, palpitations, dyspnea / orthopnea / PND, dizziness, claudication, or dependent edema.        Hyperlipidemia is not controlled with diet & meds. Patient denies myalgias or other med SE's. Last Lipids were not at goal :  Lab Results  Component Value Date   CHOL 239 (H) 02/25/2022   HDL 75 02/25/2022   LDLCALC 151 (H) 02/25/2022   TRIG 43 02/25/2022   CHOLHDL 3.2 02/25/2022     Also, the patient has history of PreDiabetes and has had no symptoms of reactive hypoglycemia, diabetic polys, paresthesias or visual blurring.  Last A1c was normal & at goal :  Lab Results  Component Value Date   HGBA1C 4.9 11/19/2021                                                         Further, the patient also has history of Vitamin D Deficiency and supplements vitamin D. Last vitamin D was not at goal :   Lab Results  Component Value Date   VD25OH 43 02/25/2022     Current Outpatient Medications on File Prior to Visit  Medication Sig   Ascorbic Acid (VITA-C PO) Take 1 tablet  daily.    aspirin 81 MG tablet Take at bedtime.    VITAMIN D) 2000 units  Take 5 capsules  daily.    colchicine 0.6 MG tablet Take 2 tabs with onset of gout flare, then 1 tab daily    VITAMIN B-12 SL Place 1 tablet under the tongue at bedtime.    Ibuprofen 200 MG tablet Take daily as needed for headache.   Magnesium 200 MG TABS Take 1 tablet  at bedtime.    rosuvastatin 20 MG tablet TAKES 1/2 TO 1 TABLET DAILY    zinc 50 MG tablet Take at  bedtime.     No Known Allergies   PMHx:   Past Medical History:  Diagnosis Date   Diverticulitis 01/22/2019   Hematuria    Hyperlipidemia    Hypertension    Hypogonadism male    Testosterone deficiency 06/13/2018      Immunization History  Administered Date(s) Administered   Influenza-Unspecified 05/04/2018   PFIZER(Purple Top)SARS-COV-2 Vaccination 09/24/2019, 10/22/2019   PPD Test 04/05/2014, 05/03/2017, 06/13/2018, 08/21/2019, 08/22/2020, 11/19/2021   Pneumococcal Polysaccharide-23 11/29/2008   Td 05/03/2017   Tdap 09/16/2006      History reviewed. No pertinent surgical history.   FHx:    Reviewed / unchanged   SHx:    Reviewed / unchanged    Systems Review:  Constitutional: Denies fever, chills, wt changes, headaches, insomnia, fatigue, night sweats, change in appetite. Eyes: Denies redness, blurred vision, diplopia, discharge, itchy, watery eyes.  ENT: Denies discharge, congestion, post nasal drip, epistaxis, sore  throat, earache, hearing loss, dental pain, tinnitus, vertigo, sinus pain, snoring.  CV: Denies chest pain, palpitations, irregular heartbeat, syncope, dyspnea, diaphoresis, orthopnea, PND, claudication or edema. Respiratory: denies cough, dyspnea, DOE, pleurisy, hoarseness, laryngitis, wheezing.  Gastrointestinal: Denies dysphagia, odynophagia, heartburn, reflux, water brash, abdominal pain or cramps, nausea, vomiting, bloating, diarrhea, constipation, hematemesis, melena, hematochezia  or hemorrhoids. Genitourinary: Denies dysuria, frequency, urgency, nocturia, hesitancy, discharge, hematuria or flank pain. Musculoskeletal: Denies arthralgias, myalgias, stiffness, jt. swelling, pain, limping or strain/sprain.  Skin: Denies pruritus, rash, hives, warts, acne, eczema or change in skin lesion(s). Neuro: No weakness, tremor, incoordination, spasms, paresthesia or pain. Psychiatric: Denies confusion, memory loss or sensory loss. Endo: Denies change in  weight, skin or hair change.  Heme/Lymph: No excessive bleeding, bruising or enlarged lymph nodes.   Physical Exam  BP 138/84   Pulse 92   Temp 97.9 F (36.6 C)   Resp 16   Ht '5\' 6"'$  (1.676 m)   Wt 195 lb 3.2 oz (88.5 kg)   SpO2 98%   BMI 31.51 kg/m   Appears  well nourished, well groomed  and in no distress.  Eyes: PERRLA, EOMs, conjunctiva no swelling or erythema. Sinuses: No frontal/maxillary tenderness ENT/Mouth: EAC's clear, TM's nl w/o erythema, bulging. Nares clear w/o erythema, swelling, exudates. Oropharynx clear without erythema or exudates. Oral hygiene is good. Tongue normal, non obstructing. Hearing intact.  Neck: Supple. Thyroid not palpable. Car 2+/2+ without bruits, nodes or JVD. Chest: Respirations nl with BS clear & equal w/o rales, rhonchi, wheezing or stridor.  Cor: Heart sounds normal w/ regular rate and rhythm without sig. murmurs, gallops, clicks or rubs. Peripheral pulses normal and equal  without edema.  Abdomen: Soft & bowel sounds normal. Non-tender w/o guarding, rebound, hernias, masses or organomegaly.  Lymphatics: Unremarkable.  Musculoskeletal: Full ROM all peripheral extremities, joint stability, 5/5 strength and normal gait.  Skin: Warm, dry without exposed rashes, lesions or ecchymosis apparent.  Neuro: Cranial nerves intact, reflexes equal bilaterally. Sensory-motor testing grossly intact. Tendon reflexes grossly intact.  Pysch: Alert & oriented x 3.  Insight and judgement nl & appropriate. No ideations.   Assessment and Plan:  - Continue medication, monitor blood pressure at home.  - Continue DASH diet.  Reminder to go to the ER if any CP,  SOB, nausea, dizziness, severe HA, changes vision/speech.  - Continue diet/meds, exercise,& lifestyle modifications.  - Continue monitor periodic cholesterol/liver & renal functions   1. Essential hypertension  - CBC with Differential/Platelet - COMPLETE METABOLIC PANEL WITH GFR - Magnesium -  TSH  2. Hyperlipidemia, mixed  - Lipid panel - TSH - rosuvastatin  20 MG tablet;   1 tablet Daily for Cholesterol  Dispense: 90 tablet; Refill: 3  3. Abnormal glucose  - Continue diet, exercise  - Lifestyle modifications.  - Monitor appropriate labs   - Hemoglobin A1c - Insulin, random  4. Vitamin D deficiency  - Continue supplementation   - VITAMIN D 25 Hydroxy   5. Gastroesophageal reflux disease without esophagitis  - CBC with Differential/Platelet  6. Medication management  - CBC with Differential/Platelet - COMPLETE METABOLIC PANEL WITH GFR - Magnesium - Lipid panel - TSH - Hemoglobin A1c - Insulin, random - VITAMIN D 25 Hydro          Discussed  regular exercise, BP monitoring, weight control to achieve/maintain BMI less than 25 and discussed med and SE's. Recommended labs to assess /monitor clinical status.  I discussed the assessment and treatment plan with the  patient. The patient was provided an opportunity to ask questions and all were answered. The patient agreed with the plan and demonstrated an understanding of the instructions.  I provided over 30 minutes of exam, counseling, chart review and  complex critical decision making.        The patient was advised to call back or seek an in-person evaluation if the symptoms worsen or if the condition fails to improve as anticipated.   Kirtland Bouchard, MD

## 2022-09-03 ENCOUNTER — Ambulatory Visit (INDEPENDENT_AMBULATORY_CARE_PROVIDER_SITE_OTHER): Payer: BC Managed Care – PPO | Admitting: Internal Medicine

## 2022-09-03 ENCOUNTER — Encounter: Payer: Self-pay | Admitting: Internal Medicine

## 2022-09-03 VITALS — BP 138/84 | HR 92 | Temp 97.9°F | Resp 16 | Ht 66.0 in | Wt 195.2 lb

## 2022-09-03 DIAGNOSIS — E559 Vitamin D deficiency, unspecified: Secondary | ICD-10-CM | POA: Diagnosis not present

## 2022-09-03 DIAGNOSIS — K219 Gastro-esophageal reflux disease without esophagitis: Secondary | ICD-10-CM

## 2022-09-03 DIAGNOSIS — I10 Essential (primary) hypertension: Secondary | ICD-10-CM

## 2022-09-03 DIAGNOSIS — Z79899 Other long term (current) drug therapy: Secondary | ICD-10-CM | POA: Diagnosis not present

## 2022-09-03 DIAGNOSIS — E782 Mixed hyperlipidemia: Secondary | ICD-10-CM | POA: Diagnosis not present

## 2022-09-03 DIAGNOSIS — R7309 Other abnormal glucose: Secondary | ICD-10-CM | POA: Diagnosis not present

## 2022-09-03 NOTE — Patient Instructions (Signed)

## 2022-09-04 LAB — LIPID PANEL
Cholesterol: 179 mg/dL (ref <200)
HDL: 72 mg/dL (ref >=40)
LDL Cholesterol (Calc): 90 mg/dL (calc)
Non-HDL Cholesterol (Calc): 107 mg/dL (calc) (ref <130)
Total CHOL/HDL Ratio: 2.5 (calc) (ref <5.0)
Triglycerides: 84 mg/dL (ref <150)

## 2022-09-04 LAB — COMPLETE METABOLIC PANEL WITH GFR
AG Ratio: 1.8 (calc) (ref 1.0–2.5)
ALT: 32 U/L (ref 9–46)
AST: 26 U/L (ref 10–35)
Albumin: 4.8 g/dL (ref 3.6–5.1)
Alkaline phosphatase (APISO): 89 U/L (ref 35–144)
BUN: 14 mg/dL (ref 7–25)
CO2: 28 mmol/L (ref 20–32)
Calcium: 10 mg/dL (ref 8.6–10.3)
Chloride: 103 mmol/L (ref 98–110)
Creat: 1 mg/dL (ref 0.70–1.30)
Globulin: 2.7 g/dL (calc) (ref 1.9–3.7)
Glucose, Bld: 96 mg/dL (ref 65–99)
Potassium: 4.5 mmol/L (ref 3.5–5.3)
Sodium: 144 mmol/L (ref 135–146)
Total Bilirubin: 0.9 mg/dL (ref 0.2–1.2)
Total Protein: 7.5 g/dL (ref 6.1–8.1)
eGFR: 91 mL/min/{1.73_m2} (ref >=60)

## 2022-09-04 LAB — CBC WITH DIFFERENTIAL/PLATELET
Absolute Monocytes: 504 cells/uL (ref 200–950)
Basophils Absolute: 30 cells/uL (ref 0–200)
Basophils Relative: 0.5 %
Eosinophils Absolute: 72 cells/uL (ref 15–500)
Eosinophils Relative: 1.2 %
HCT: 46.5 % (ref 38.5–50.0)
Hemoglobin: 16.3 g/dL (ref 13.2–17.1)
Lymphs Abs: 1230 cells/uL (ref 850–3900)
MCH: 34.3 pg — ABNORMAL HIGH (ref 27.0–33.0)
MCHC: 35.1 g/dL (ref 32.0–36.0)
MCV: 97.9 fL (ref 80.0–100.0)
MPV: 10.2 fL (ref 7.5–12.5)
Monocytes Relative: 8.4 %
Neutro Abs: 4164 cells/uL (ref 1500–7800)
Neutrophils Relative %: 69.4 %
Platelets: 226 10*3/uL (ref 140–400)
RBC: 4.75 10*6/uL (ref 4.20–5.80)
RDW: 13.1 % (ref 11.0–15.0)
Total Lymphocyte: 20.5 %
WBC: 6 10*3/uL (ref 3.8–10.8)

## 2022-09-04 LAB — HEMOGLOBIN A1C
Hgb A1c MFr Bld: 5 % of total Hgb (ref <5.7)
Mean Plasma Glucose: 97 mg/dL
eAG (mmol/L): 5.4 mmol/L

## 2022-09-04 LAB — TSH: TSH: 2.45 mIU/L (ref 0.40–4.50)

## 2022-09-04 LAB — INSULIN, RANDOM: Insulin: 10.8 u[IU]/mL

## 2022-09-04 LAB — VITAMIN D 25 HYDROXY (VIT D DEFICIENCY, FRACTURES): Vit D, 25-Hydroxy: 46 ng/mL (ref 30–100)

## 2022-09-04 LAB — MAGNESIUM: Magnesium: 2 mg/dL (ref 1.5–2.5)

## 2022-09-06 ENCOUNTER — Encounter: Payer: Self-pay | Admitting: Internal Medicine

## 2022-09-06 MED ORDER — ROSUVASTATIN CALCIUM 20 MG PO TABS: ORAL_TABLET | ORAL | 3 refills | Status: DC

## 2022-09-06 MED ORDER — PAROXETINE HCL 20 MG PO TABS: ORAL_TABLET | ORAL | 3 refills | Status: DC

## 2022-09-06 NOTE — Progress Notes (Signed)
<><><><><><><><><><><><><><><><><><><><><><><><><><><><><><><><><> <><><><><><><><><><><><><><><><><><><><><><><><><><><><><><><><><> -   Test results slightly outside the reference range are not unusual. If there is anything important, I will review this with you,  otherwise it is considered normal test values.  If you have further questions,  please do not hesitate to contact me at the office or via My Chart.  <><><><><><><><><><><><><><><><><><><><><><><><><><><><><><><><><> <><><><><><><><><><><><><><><><><><><><><><><><><><><><><><><><><>  -  Chol = 179  &   LDL Chol = 90   - both   Excellent   - Very low risk for Heart Attack  / Stroke <><><><><><><><><><><><><><><><><><><><><><><><><><><><><><><><><> <><><><><><><><><><><><><><><><><><><><><><><><><><><><><><><><><>  -  A1c  - Normal - No Diabetes  - Great !  <><><><><><><><><><><><><><><><><><><><><><><><><><><><><><><><><>  -  Vitamin D = 46 - is very Low  !   - Vitamin D goal is between 70-100.   - Please make sure that you are taking                                                        your Vitamin D - 10,000 units  /day as directed. !   - It is very important as a natural anti-inflammatory and helping the                                          immune system protect against viral infections, like the Covid-19    helping hair, skin, and nails, as well as reducing stroke and  heart attack risk.   - It helps your bones and helps with mood.  - It also decreases numerous cancer risks so please  take it as directed.   - Low Vit D is associated with a 200-300% higher risk for CANCER   and 200-300% higher risk for HEART   ATTACK  &  STROKE.    - It is also associated with higher death rate at younger ages,   autoimmune diseases like Rheumatoid arthritis, Lupus, Multiple Sclerosis.     - Also many other serious conditions, like depression, Alzheimer's Dementia,  -  muscle aches, fatigue, fibromyalgia   <><><><><><><><><><><><><><><><><><><><><><><><><><><><><><><><><>  - All Else - CBC - Kidneys - Electrolytes - Liver - Magnesium & Thyroid    - all  Normal / OK <><><><><><><><><><><><><><><><><><><><><><><><><><><><><><><><><> <><><><><><><><><><><><><><><><><><><><><><><><><><><><><><><><><>

## 2022-09-24 ENCOUNTER — Encounter: Payer: Self-pay | Admitting: Internal Medicine

## 2022-11-25 ENCOUNTER — Encounter: Payer: BC Managed Care – PPO | Admitting: Internal Medicine

## 2022-12-03 ENCOUNTER — Ambulatory Visit: Payer: BC Managed Care – PPO | Admitting: Nurse Practitioner

## 2022-12-21 ENCOUNTER — Ambulatory Visit: Payer: BC Managed Care – PPO | Admitting: Nurse Practitioner

## 2022-12-30 ENCOUNTER — Ambulatory Visit (INDEPENDENT_AMBULATORY_CARE_PROVIDER_SITE_OTHER): Payer: BC Managed Care – PPO | Admitting: Nurse Practitioner

## 2022-12-30 ENCOUNTER — Encounter: Payer: Self-pay | Admitting: Nurse Practitioner

## 2022-12-30 VITALS — BP 128/88 | HR 108 | Temp 97.6°F | Ht 66.0 in | Wt 196.6 lb

## 2022-12-30 DIAGNOSIS — E559 Vitamin D deficiency, unspecified: Secondary | ICD-10-CM | POA: Diagnosis not present

## 2022-12-30 DIAGNOSIS — I10 Essential (primary) hypertension: Secondary | ICD-10-CM | POA: Diagnosis not present

## 2022-12-30 DIAGNOSIS — E782 Mixed hyperlipidemia: Secondary | ICD-10-CM | POA: Diagnosis not present

## 2022-12-30 DIAGNOSIS — N521 Erectile dysfunction due to diseases classified elsewhere: Secondary | ICD-10-CM

## 2022-12-30 DIAGNOSIS — K219 Gastro-esophageal reflux disease without esophagitis: Secondary | ICD-10-CM

## 2022-12-30 DIAGNOSIS — E669 Obesity, unspecified: Secondary | ICD-10-CM | POA: Diagnosis not present

## 2022-12-30 DIAGNOSIS — Z79899 Other long term (current) drug therapy: Secondary | ICD-10-CM

## 2022-12-30 DIAGNOSIS — Z720 Tobacco use: Secondary | ICD-10-CM

## 2022-12-30 DIAGNOSIS — E66811 Obesity, class 1: Secondary | ICD-10-CM

## 2022-12-30 DIAGNOSIS — E88819 Insulin resistance, unspecified: Secondary | ICD-10-CM

## 2022-12-30 MED ORDER — TADALAFIL 10 MG PO TABS: 10.0000 mg | ORAL_TABLET | ORAL | 1 refills | Status: DC | PRN

## 2022-12-30 NOTE — Patient Instructions (Signed)
Tadalafil Tablets (Erectile Dysfunction, BPH) What is this medication? TADALAFIL (tah DA la fil) treats erectile dysfunction (ED). It works by increasing blood flow to the penis, which helps to maintain an erection. It may also be used to treat symptoms of an enlarged prostate (benign prostatic hyperplasia). This medicine may be used for other purposes; ask your health care provider or pharmacist if you have questions. COMMON BRAND NAME(S): Kathaleen Bury, Cialis What should I tell my care team before I take this medication? They need to know if you have any of these conditions: Anatomical deformation of the penis, Peyronie's disease, or history of priapism (painful and prolonged erection) Bleeding disorders Eye or vision problems, including a rare inherited eye disease called retinitis pigmentosa Heart disease, angina, a history of heart attack, irregular heart beats, or other heart problems High or low blood pressure History of blood diseases, like sickle cell anemia or leukemia History of stomach bleeding Kidney disease Liver disease Stroke An unusual or allergic reaction to tadalafil, other medications, foods, dyes, or preservatives Pregnant or trying to get pregnant Breast-feeding How should I use this medication? Take this medication by mouth with a glass of water. Follow the directions on the prescription label. You may take this medication with or without meals. When this medication is used for erection problems, your care team may prescribe it to be taken once daily or as needed. If you are taking the medication as needed, you may be able to have sexual activity 30 minutes after taking it and for up to 36 hours after taking it. Whether you are taking the medication as needed or once daily, you should not take more than one dose per day. If you are taking this medication for symptoms of benign prostatic hyperplasia (BPH) or to treat both BPH and an erection problem, take the dose once  daily at about the same time each day. Do not take your medication more often than directed. Talk to your care team about the use of this medication in children. Special care may be needed. Overdosage: If you think you have taken too much of this medicine contact a poison control center or emergency room at once. NOTE: This medicine is only for you. Do not share this medicine with others. What if I miss a dose? If you are taking this medication as needed for erection problems, this does not apply. If you miss a dose while taking this medication once daily for an erection problem, benign prostatic hyperplasia, or both, take it as soon as you remember, but do not take more than one dose per day. What may interact with this medication? Do not take this medication with any of the following: Nitrates like amyl nitrite, isosorbide dinitrate, isosorbide mononitrate, nitroglycerin Other medications for erectile dysfunction like avanafil, sildenafil, vardenafil Other tadalafil products (Adcirca) Riociguat This medication may also interact with the following: Certain medications for high blood pressure Certain medications for the treatment of HIV infection or AIDS Certain medications used for fungal or yeast infections, like fluconazole, itraconazole, ketoconazole, and voriconazole Certain medications used for seizures like carbamazepine, phenytoin, and phenobarbital Grapefruit juice Macrolide antibiotics like clarithromycin, erythromycin, troleandomycin Medications for prostate problems Rifabutin, rifampin or rifapentine This list may not describe all possible interactions. Give your health care provider a list of all the medicines, herbs, non-prescription drugs, or dietary supplements you use. Also tell them if you smoke, drink alcohol, or use illegal drugs. Some items may interact with your medicine. What should I watch for  while using this medication? If you notice any changes in your vision while  taking this medication, call your care team as soon as possible. Stop using this medication and call your care team right away if you have a loss of sight in one or both eyes. Contact your care team right away if the erection lasts longer than 4 hours or if it becomes painful. This may be a sign of serious problem and must be treated right away to prevent permanent damage. If you experience symptoms of nausea, dizziness, chest pain or arm pain upon initiation of sexual activity after taking this medication, you should refrain from further activity and call your care team as soon as possible. Do not drink alcohol to excess (examples, 5 glasses of wine or 5 shots of whiskey) when taking this medication. When taken in excess, alcohol can increase your chances of getting a headache or getting dizzy, increasing your heart rate or lowering your blood pressure. Using this medication does not protect you or your partner against HIV infection (the virus that causes AIDS) or other sexually transmitted diseases. What side effects may I notice from receiving this medication? Side effects that you should report to your care team as soon as possible: Allergic reactions--skin rash, itching, hives, swelling of the face, lips, tongue, or throat Hearing loss or ringing in ears Heart attack--pain or tightness in the chest, shoulders, arms, or jaw, nausea, shortness of breath, cold or clammy skin, feeling faint or lightheaded Low blood pressure--dizziness, feeling faint or lightheaded, blurry vision Prolonged or painful erection Redness, blistering, peeling, or loosening of the skin, including inside the mouth Stroke--sudden numbness or weakness of the face, arm, or leg, trouble speaking, confusion, trouble walking, loss of balance or coordination, dizziness, severe headache, change in vision Sudden vision loss in one or both eyes Side effects that usually do not require medical attention (report to your care team if  they continue or are bothersome): Back pain Facial flushing or redness Headache Muscle pain Runny or stuffy nose Upset stomach This list may not describe all possible side effects. Call your doctor for medical advice about side effects. You may report side effects to FDA at 1-800-FDA-1088. Where should I keep my medication? Keep out of the reach of children. Store at room temperature between 15 and 30 degrees C (59 and 86 degrees F). Throw away any unused medication after the expiration date. NOTE: This sheet is a summary. It may not cover all possible information. If you have questions about this medicine, talk to your doctor, pharmacist, or health care provider.  2024 Elsevier/Gold Standard (2020-09-12 00:00:00)

## 2022-12-30 NOTE — Progress Notes (Signed)
FOLLOW UP  Assessment and Plan:   Hypertension Discussed DASH (Dietary Approaches to Stop Hypertension) DASH diet is lower in sodium than a typical American diet. Cut back on foods that are high in saturated fat, cholesterol, and trans fats. Eat more whole-grain foods, fish, poultry, and nuts Remain active and exercise as tolerated daily.  Monitor BP at home-Call if greater than 130/80.  Check CMP/CBC  Cholesterol Controlled Discussed lifestyle modifications. Recommended diet heavy in fruits and veggies, omega 3's. Decrease consumption of animal meats, cheeses, and dairy products. Remain active and exercise as tolerated. Continue to monitor. Check lipids/TSH   Obesity with co morbidities Discussed appropriate BMI Diet modification. Physical activity. Encouraged/praised to build confidence.  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 Lifestyle modification:  wt loss, avoid meals 2-3h before bedtime. Consider eliminating food triggers:  chocolate, caffeine, EtOH, acid/spicy food.  Abnormal glucose/insulin resistance Education: Reviewed 'ABCs' of diabetes management  Discussed goals to be met and/or maintained include A1C (<7) Blood pressure (<130/80) Cholesterol (LDL <70) Continue Eye Exam yearly  Continue Dental Exam Q6 mo Discussed dietary recommendations Discussed Physical Activity recommendations Check A1C  Medication Management All medications discussed and reviewed in full. All questions and concerns regarding medications addressed.    Erectile Dysfunction Start Cialis  as directed SE of  medications discussed.  Review of most recent blood work is stable.  Defer until NOV.  Notify office for further evaluation and treatment, questions or concerns if any reported s/s fail to improve.   The patient was advised to call back or seek an in-person evaluation if any symptoms worsen or if the condition fails to improve as anticipated.   Further disposition pending results of labs. Discussed med's effects and SE's.    I discussed the assessment and treatment plan with the patient. The patient was provided an opportunity to ask questions and all were answered. The patient agreed with the plan and demonstrated an understanding of the instructions.  Discussed med's effects and SE's. Screening labs and tests as requested with regular follow-up as recommended.  I provided 25 minutes of face-to-face time during this encounter including counseling, chart review, and critical decision making was preformed.  Today's Plan of Care is based on a patient-centered health care approach known as shared decision making - the decisions, tests and treatments allow for patient preferences and values to be balanced with clinical evidence.      Future Appointments  Date Time Provider Department Center  03/08/2023 11:00 AM Lucky Cowboy, MD [facility] None    ----------------------------------------------------------------------------------------------------------------------  HPI 52 y.o. male  presents for 6 month follow up on hypertension, cholesterol, glucose management, obesity, testosterone def and vitamin D deficiency.   Overall he reports doing well today.    He has been out of a relationship for over 1 year, however, recently started to date someone 6 months ago.  Reports being able to achieve an erection, but unable to sustain for longer than 10-15 minutes.  States that most often while starting out kissing but by the time sexual intercourse is occurring he no longer  has an erection.  He currently takes zinc supplement for testosterone support.  He  does not have a hx of OSA.  His cholesterol is well controlled.    BMI is Body mass index is 31.73 kg/m., he has been working on diet and exercise. Wt Readings from Last 3 Encounters:  12/30/22 196 lb 9.6 oz (89.2 kg)  09/03/22 195 lb 3.2 oz (88.5 kg)  02/25/22 180 lb 3.2 oz (81.7 kg)   Today their BP is BP: 128/88  He does not workout. He denies chest pain, shortness of breath, dizziness.   He is on cholesterol medication - he is on rosuvastatin 20 mg daily, and denies myalgias. His cholesterol is not at goal. The cholesterol last visit was:   Lab Results  Component Value Date   CHOL 179 09/03/2022   HDL 72 09/03/2022   LDLCALC 90 09/03/2022   TRIG 84 09/03/2022   CHOLHDL 2.5 09/03/2022    He has been working on diet and exercise for glucose management, and denies increased appetite, nausea, paresthesia of the feet, polydipsia, polyuria, visual disturbances and vomiting. Last A1C in the office was:  Lab Results  Component Value Date   HGBA1C 5.0 09/03/2022   Patient is on Vitamin D supplement, taking 16109 IU daily:     Lab Results  Component Value Date   VD25OH 46 09/03/2022     He has testosterone def, he is currently taking zinc 50 mg daily, stopped shots and didn't notice a difference.  Lab Results  Component Value Date   TESTOSTERONE 201 (L) 11/19/2021   He continues on daily B12 supplement, doing well with SL  Lab Results  Component Value Date   VITAMINB12 521 11/19/2021    Current Medications:  Current Outpatient Medications on File Prior to Visit  Medication Sig   Ascorbic Acid (VITA-C PO) Take 1 tablet by mouth daily.    aspirin 81 MG tablet Take 81 mg by mouth at bedtime.    Cholecalciferol (VITAMIN D) 2000 units CAPS Take 5 capsules by mouth daily.    colchicine 0.6 MG tablet Take 2 tabs with onset of gout flare, then 1 tab daily as needed.   Cyanocobalamin (VITAMIN B-12 SL)  Place 1 tablet under the tongue at bedtime.    Magnesium 200 MG TABS Take 1 tablet by mouth at bedtime.    PARoxetine (PAXIL) 20 MG tablet Take 1/2 to 1 tablet  Daily  for Mood   rosuvastatin (CRESTOR) 20 MG tablet Take 1 tablet Daily for Cholesterol   zinc gluconate 50 MG tablet Take 50 mg by mouth at bedtime.   ibuprofen (ADVIL) 200 MG tablet Take 400 mg by mouth daily as needed for headache. (Patient not taking: Reported on 12/30/2022)   No current facility-administered medications on file prior to visit.     Allergies: No Known Allergies   Medical History:  Past Medical History:  Diagnosis Date   Diverticulitis 01/22/2019   Hematuria    Hyperlipidemia    Hypertension    Hypogonadism male    Testosterone deficiency 06/13/2018   Family history- Reviewed and unchanged Social history- Reviewed and unchanged   Review of Systems:  Review of Systems  Constitutional:  Negative for malaise/fatigue and weight loss.  HENT:  Negative for hearing loss and tinnitus.   Eyes:  Negative for blurred vision and double vision.  Respiratory:  Negative for cough, shortness of breath and wheezing.   Cardiovascular:  Negative for chest pain, palpitations, orthopnea, claudication and leg swelling.  Gastrointestinal:  Negative for abdominal pain, blood in stool, constipation, diarrhea, heartburn, melena,  nausea and vomiting.  Genitourinary: Negative.   Musculoskeletal:  Negative for joint pain and myalgias.  Skin:  Negative for rash.  Neurological:  Negative for dizziness, tingling, sensory change, weakness and headaches.  Endo/Heme/Allergies:  Negative for polydipsia.  Psychiatric/Behavioral: Negative.    All other systems reviewed and are negative.     Physical Exam: BP 128/88   Pulse (!) 108   Temp 97.6 F (36.4 C)   Ht 5\' 6"  (1.676 m)   Wt 196 lb 9.6 oz (89.2 kg)   SpO2 99%   BMI 31.73 kg/m  Wt Readings from Last 3 Encounters:  12/30/22 196 lb 9.6 oz (89.2 kg)  09/03/22 195 lb  3.2 oz (88.5 kg)  02/25/22 180 lb 3.2 oz (81.7 kg)   General Appearance: Well nourished, in no apparent distress. Eyes: PERRLA, EOMs, conjunctiva no swelling or erythema Sinuses: No Frontal/maxillary tenderness ENT/Mouth: Ext aud canals clear, TMs without erythema, bulging. No erythema, swelling, or exudate on post pharynx.  Tonsils not swollen or erythematous. Hearing normal.  Neck: Supple, thyroid normal.  Respiratory: Respiratory effort normal, BS equal bilaterally without rales, rhonchi, wheezing or stridor.  Cardio: RRR with no MRGs. Brisk peripheral pulses without edema.  Abdomen: Soft, obese abdomen, + BS.  Non tender, no guarding, rebound, hernias, masses. Lymphatics: Non tender without lymphadenopathy.  Musculoskeletal: Full ROM, 5/5 strength, Normal gait. R toe MTP joint with mild bony enlargement and erythema, no heat or bogginess.  Skin: Warm, dry without rashes, lesions, ecchymosis.  Neuro: Cranial nerves intact. No cerebellar symptoms.  Psych: Awake and oriented X 3, normal affect, Insight and Judgment appropriate.    Adela Glimpse, NP 4:09 PM [facility]

## 2022-12-31 ENCOUNTER — Encounter: Payer: Self-pay | Admitting: Nurse Practitioner

## 2023-03-07 ENCOUNTER — Encounter: Payer: Self-pay | Admitting: Internal Medicine

## 2023-03-07 NOTE — Progress Notes (Unsigned)
Annual  Screening/Preventative Visit  & Comprehensive Evaluation & Examination     Future Appointments  Date Time Provider Department  03/08/2023 11:00 AM Lucky Cowboy, MD [facility]  03/20/2024 11:00 AM Lucky Cowboy, MD [facility]                                            This very nice 52 y.o. MWM presents for a Screening /Preventative Visit & comprehensive evaluation and management of multiple medical co-morbidities.  Patient has been followed for HTN, HLD, Prediabetes and Vitamin D Deficiency.                                                Patient has remote hx/o elevated BP and has been followed since 2010 for labile HTN.   Patient's BP has been controlled and today's BP is 144/90.  Patient denies any cardiac symptoms as chest pain, palpitations, shortness of breath, dizziness or ankle swelling.                                                Patient's hyperlipidemia is not controlled with diet and Rosuvastatin. Patient denies myalgias or other medication SE's. Last lipids were not at goal :  Lab Results  Component Value Date   CHOL 179 09/03/2022   HDL 72 09/03/2022   LDLCALC 90 09/03/2022   TRIG 84 09/03/2022   CHOLHDL 2.5 09/03/2022                                               Patient is moderately overweight (BMI 32.7+) and is monitored expectantly for glucose intolerance and patient denies reactive hypoglycemic symptoms, visual blurring, diabetic polys or paresthesias. Last A1c was normal & at goal:   Lab Results  Component Value Date   HGBA1C 5.0 09/03/2022                                                The patient has history of Vitamin D Deficiency ("27" /2008 - "32" /2017" & "31" /2018) and last vitamin D was still low:   Lab Results  Component Value Date   VD25OH 46 09/03/2022          Current Outpatient Medications on File Prior to Visit  Medication Sig   VITA-C Take 1 tablet daily.    aspirin 81 MG tablet Take  at bedtime.    VITAMIN D  2000 units  Take 5 capsules (10,000 u) daily.    VITAMIN B-12 SL Place 1 tablet under the tongue at bedtime.    ibuprofen  200 MG tablet Take 400 mg  daily as needed for headache.   Magnesium 200 MG TABS Take 1 tablet  at bedtime.    rosuvastatin 40 MG tablet TAKES 1/2 TO 1 TABLET DAILY    Zinc 50 MG tablet  Take at bedtime.      No Known Allergies        Past Medical History:  Diagnosis Date   Hematuria     Hyperlipidemia     Hypertension     Hypogonadism male          Health Maintenance  Topic Date Due   Hepatitis C Screening  Never done   INFLUENZA VACCINE  02/11/2020   COVID-19 Vaccine (3 - Booster for Pfizer series) 04/22/2020   TETANUS/TDAP  05/04/2027   HIV Screening  Completed        Immunization History  Administered Date(s) Administered   Influenza 05/04/2018   PFIZER SARS-COV-2 Vacc 09/24/2019, 10/22/2019   PPD Test 05/03/2017, 06/13/2018, 08/21/2019   Pneumococcal -23 11/29/2008   Td 05/03/2017   Tdap 09/16/2006      No past surgical history on file.          Family History  Problem Relation Age of Onset   Cancer Mother          thyroid   Heart disease Father     Hypertension Father     Hyperlipidemia Father      Social History         Socioeconomic History   Marital status: Married      Spouse name: Not on file   Number of children: Not on file  Occupational History      Tobacco Use   Smoking status: Former Smoker      Quit date: 06/13/1993      Years since quitting: 27.2   Smokeless tobacco: Current User      Types: Chew  Substance and Sexual Activity   Alcohol use: Yes      Alcohol/week: 1.0 standard drink      Types: 1 Standard drinks or equivalent per week   Drug use: No   Sexual activity: Not on file     ROS Constitutional: Denies fever, chills, weight loss/gain, headaches, insomnia,  night sweats or change in appetite. Does c/o fatigue. Eyes: Denies redness, blurred vision, diplopia, discharge, itchy or watery eyes.  ENT:  Denies discharge, congestion, post nasal drip, epistaxis, sore throat, earache, hearing loss, dental pain, Tinnitus, Vertigo, Sinus pain or snoring.  Cardio: Denies chest pain, palpitations, irregular heartbeat, syncope, dyspnea, diaphoresis, orthopnea, PND, claudication or edema Respiratory: denies cough, dyspnea, DOE, pleurisy, hoarseness, laryngitis or wheezing.  Gastrointestinal: Denies dysphagia, heartburn, reflux, water brash, pain, cramps, nausea, vomiting, bloating, diarrhea, constipation, hematemesis, melena, hematochezia, jaundice or hemorrhoids Genito.urinary:  Has urgency, nocturia x 0-1 n& occasional hesitancy. Denies dysuria, frequency, discharge, hematuria or flank pain. Musculoskeletal: Denies arthralgia, myalgia, stiffness, Jt. Swelling, pain, limp or strain/sprain. Denies Falls. Skin: Denies puritis, rash, hives, warts, acne, eczema or change in skin lesion Neuro: No weakness, tremor, incoordination, spasms, paresthesia or pain Psychiatric: Denies confusion, memory loss or sensory loss. Denies Depression. Endocrine: Denies change in weight, skin, hair change, nocturia, and paresthesia, diabetic polys, visual blurring or hyper / hypo glycemic episodes.  Heme/Lymph: No excessive bleeding, bruising or enlarged lymph nodes.   Physical Exam   BP 124/88   Pulse 95   Temp (!) 97.2 F (36.2 C)   Resp 16   Ht 5\' 6"  (1.676 m)   Wt 209 lb 3.2 oz (94.9 kg)   SpO2 97%   BMI 33.77 kg/m    General Appearance: Well nourished and well groomed and in no apparent distress.   Eyes: PERRLA, EOMs, conjunctiva no swelling or erythema, normal  fundi and vessels. Sinuses: No frontal/maxillary tenderness ENT/Mouth: EACs patent / TMs  nl. Nares clear without erythema, swelling, mucoid exudates. Oral hygiene is good. No erythema, swelling, or exudate. Tongue normal, non-obstructing. Tonsils not swollen or erythematous. Hearing normal.  Neck: Supple, thyroid not palpable. No bruits, nodes or  JVD. Respiratory: Respiratory effort normal.  BS equal and clear bilateral without rales, rhonci, wheezing or stridor. Cardio: Heart sounds are normal with regular rate and rhythm and no murmurs, rubs or gallops. Peripheral pulses are normal and equal bilaterally without edema. No aortic or femoral bruits. Chest: symmetric with normal excursions and percussion.  Abdomen: Soft, with Nl bowel sounds. Nontender, no guarding, rebound, hernias, masses, or organomegaly.  Lymphatics: Non tender without lymphadenopathy.  Musculoskeletal: Full ROM all peripheral extremities, joint stability, 5/5 strength, and normal gait. Skin: Warm and dry without rashes, lesions, cyanosis, clubbing or  ecchymosis.  Neuro: Cranial nerves intact, reflexes equal bilaterally. Normal muscle tone, no cerebellar symptoms. Sensation intact.  Pysch: Alert and oriented X 3 with normal affect, insight and judgment appropriate.    Assessment and Plan   1. Annual Preventative/Screening Exam     2. Labile hypertension  - EKG 12-Lead - [place], RETROPERITNL ABD,  LTD - Urinalysis, Routine w reflex microscopic - Microalbumin / creatinine urine ratio - CBC with Differential/Platelet - COMPLETE METABOLIC PANEL WITH GFR - Magnesium - TSH   3. Hyperlipidemia, mixed  - EKG 12-Lead - [place], RETROPERITNL ABD,  LTD - Lipid panel   4. Abnormal glucose  - EKG 12-Lead - [place], RETROPERITNL ABD,  LTD - Hemoglobin A1c - Insulin, random   5. Vitamin D deficiency  - VITAMIN D 25 Hydroxy    6. Vitamin B12 deficiency  - Vitamin B12   7. Testosterone deficiency  - Testosterone   8. Screening for colorectal cancer  - POC Hemoccult Bld/Stl    9. Screening-pulmonary TB  - TB Skin Test   10. Prostate cancer screening  - PSA   11. Screening for heart disease  - EKG 12-Lead   12. FHx: heart disease  - EKG 12-Lead - [place], RETROPERITNL ABD,  LTD   13. Former smoker  - EKG 12-Lead - [place], RETROPERITNL ABD,   LTD   14. Screening for AAA (aortic abdominal aneurysm)  - [place], RETROPERITNL ABD,  LTD   15. Fatigue  - Iron, Total/Total Iron Binding Cap - Vitamin B12 - CBC with Differential/Platelet - TSH   16. Medication management  - Urinalysis, Routine w reflex microscopic - Microalbumin / creatinine urine ratio - CBC with Differential/Platelet - COMPLETE METABOLIC PANEL WITH GFR - Magnesium - Lipid panel - TSH - Hemoglobin A1c - Insulin, random - VITAMIN D 25 Hydroxy                                                              Patient was counseled in prudent diet, weight control to achieve/ maintain BMI less than 25, BP monitoring, regular exercise and medications as discussed.  Discussed med effects and SE's. Routine screening labs and tests as requested with regular follow-up as recommended. Over 40 minutes of exam, counseling, chart review and high complex critical decision making was performed     Marinus Maw, MD

## 2023-03-07 NOTE — Patient Instructions (Signed)

## 2023-03-08 ENCOUNTER — Ambulatory Visit (INDEPENDENT_AMBULATORY_CARE_PROVIDER_SITE_OTHER): Payer: BC Managed Care – PPO | Admitting: Internal Medicine

## 2023-03-08 ENCOUNTER — Encounter: Payer: Self-pay | Admitting: Internal Medicine

## 2023-03-08 VITALS — BP 144/90 | HR 96 | Temp 97.9°F | Resp 16 | Ht 66.0 in | Wt 199.4 lb

## 2023-03-08 DIAGNOSIS — Z87891 Personal history of nicotine dependence: Secondary | ICD-10-CM

## 2023-03-08 DIAGNOSIS — E559 Vitamin D deficiency, unspecified: Secondary | ICD-10-CM | POA: Diagnosis not present

## 2023-03-08 DIAGNOSIS — R0989 Other specified symptoms and signs involving the circulatory and respiratory systems: Secondary | ICD-10-CM

## 2023-03-08 DIAGNOSIS — E349 Endocrine disorder, unspecified: Secondary | ICD-10-CM

## 2023-03-08 DIAGNOSIS — Z0000 Encounter for general adult medical examination without abnormal findings: Secondary | ICD-10-CM | POA: Diagnosis not present

## 2023-03-08 DIAGNOSIS — Z1329 Encounter for screening for other suspected endocrine disorder: Secondary | ICD-10-CM | POA: Diagnosis not present

## 2023-03-08 DIAGNOSIS — Z79899 Other long term (current) drug therapy: Secondary | ICD-10-CM

## 2023-03-08 DIAGNOSIS — Z125 Encounter for screening for malignant neoplasm of prostate: Secondary | ICD-10-CM

## 2023-03-08 DIAGNOSIS — R350 Frequency of micturition: Secondary | ICD-10-CM

## 2023-03-08 DIAGNOSIS — Z111 Encounter for screening for respiratory tuberculosis: Secondary | ICD-10-CM

## 2023-03-08 DIAGNOSIS — N401 Enlarged prostate with lower urinary tract symptoms: Secondary | ICD-10-CM

## 2023-03-08 DIAGNOSIS — E538 Deficiency of other specified B group vitamins: Secondary | ICD-10-CM

## 2023-03-08 DIAGNOSIS — I700 Atherosclerosis of aorta: Secondary | ICD-10-CM

## 2023-03-08 DIAGNOSIS — Z1211 Encounter for screening for malignant neoplasm of colon: Secondary | ICD-10-CM

## 2023-03-08 DIAGNOSIS — Z1389 Encounter for screening for other disorder: Secondary | ICD-10-CM | POA: Diagnosis not present

## 2023-03-08 DIAGNOSIS — Z0001 Encounter for general adult medical examination with abnormal findings: Secondary | ICD-10-CM

## 2023-03-08 DIAGNOSIS — Z135 Encounter for screening for eye and ear disorders: Secondary | ICD-10-CM

## 2023-03-08 DIAGNOSIS — Z13220 Encounter for screening for lipoid disorders: Secondary | ICD-10-CM | POA: Diagnosis not present

## 2023-03-08 DIAGNOSIS — Z131 Encounter for screening for diabetes mellitus: Secondary | ICD-10-CM

## 2023-03-08 DIAGNOSIS — R7309 Other abnormal glucose: Secondary | ICD-10-CM

## 2023-03-08 DIAGNOSIS — Z136 Encounter for screening for cardiovascular disorders: Secondary | ICD-10-CM | POA: Diagnosis not present

## 2023-03-08 DIAGNOSIS — Z130 Encounter for screening for diseases of the blood and blood-forming organs and certain disorders involving the immune mechanism: Secondary | ICD-10-CM

## 2023-03-08 DIAGNOSIS — E782 Mixed hyperlipidemia: Secondary | ICD-10-CM

## 2023-03-08 DIAGNOSIS — R5383 Other fatigue: Secondary | ICD-10-CM

## 2023-03-08 DIAGNOSIS — Z8249 Family history of ischemic heart disease and other diseases of the circulatory system: Secondary | ICD-10-CM

## 2023-03-09 ENCOUNTER — Other Ambulatory Visit: Payer: Self-pay | Admitting: Internal Medicine

## 2023-03-09 LAB — LIPID PANEL
Cholesterol: 235 mg/dL — ABNORMAL HIGH (ref <200)
HDL: 59 mg/dL (ref >=40)
LDL Cholesterol (Calc): 156 mg/dL — ABNORMAL HIGH
Non-HDL Cholesterol (Calc): 176 mg/dL — ABNORMAL HIGH (ref <130)
Total CHOL/HDL Ratio: 4 (calc) (ref <5.0)
Triglycerides: 91 mg/dL (ref <150)

## 2023-03-09 LAB — CBC WITH DIFFERENTIAL/PLATELET
Absolute Monocytes: 542 {cells}/uL (ref 200–950)
Basophils Absolute: 32 {cells}/uL (ref 0–200)
Basophils Relative: 0.5 %
Eosinophils Absolute: 82 {cells}/uL (ref 15–500)
Eosinophils Relative: 1.3 %
HCT: 47.4 % (ref 38.5–50.0)
Hemoglobin: 16.3 g/dL (ref 13.2–17.1)
Lymphs Abs: 1147 {cells}/uL (ref 850–3900)
MCH: 34 pg — ABNORMAL HIGH (ref 27.0–33.0)
MCHC: 34.4 g/dL (ref 32.0–36.0)
MCV: 98.8 fL (ref 80.0–100.0)
MPV: 10.5 fL (ref 7.5–12.5)
Monocytes Relative: 8.6 %
Neutro Abs: 4498 {cells}/uL (ref 1500–7800)
Neutrophils Relative %: 71.4 %
Platelets: 262 10*3/uL (ref 140–400)
RBC: 4.8 10*6/uL (ref 4.20–5.80)
RDW: 13 % (ref 11.0–15.0)
Total Lymphocyte: 18.2 %
WBC: 6.3 10*3/uL (ref 3.8–10.8)

## 2023-03-09 LAB — COMPLETE METABOLIC PANEL WITH GFR
AG Ratio: 1.7 (calc) (ref 1.0–2.5)
ALT: 38 U/L (ref 9–46)
AST: 23 U/L (ref 10–35)
Albumin: 4.8 g/dL (ref 3.6–5.1)
Alkaline phosphatase (APISO): 96 U/L (ref 35–144)
BUN: 13 mg/dL (ref 7–25)
CO2: 31 mmol/L (ref 20–32)
Calcium: 10.1 mg/dL (ref 8.6–10.3)
Chloride: 104 mmol/L (ref 98–110)
Creat: 0.85 mg/dL (ref 0.70–1.30)
Globulin: 2.8 g/dL (ref 1.9–3.7)
Glucose, Bld: 88 mg/dL (ref 65–99)
Potassium: 4.5 mmol/L (ref 3.5–5.3)
Sodium: 142 mmol/L (ref 135–146)
Total Bilirubin: 0.7 mg/dL (ref 0.2–1.2)
Total Protein: 7.6 g/dL (ref 6.1–8.1)
eGFR: 105 mL/min/{1.73_m2} (ref >=60)

## 2023-03-09 LAB — IRON, TOTAL/TOTAL IRON BINDING CAP
%SAT: 25 % (ref 20–48)
Iron: 104 ug/dL (ref 50–180)
TIBC: 414 ug/dL (ref 250–425)

## 2023-03-09 LAB — URINALYSIS, ROUTINE W REFLEX MICROSCOPIC
Bilirubin Urine: NEGATIVE
Hgb urine dipstick: NEGATIVE
Hyaline Cast: NONE SEEN /LPF
Ketones, ur: NEGATIVE
Leukocytes,Ua: NEGATIVE
Nitrite: NEGATIVE
Specific Gravity, Urine: 1.021 (ref 1.001–1.035)
Squamous Epithelial / HPF: NONE SEEN /HPF (ref <=5)
pH: 7 (ref 5.0–8.0)

## 2023-03-09 LAB — VITAMIN B12: Vitamin B-12: 362 pg/mL (ref 200–1100)

## 2023-03-09 LAB — MAGNESIUM: Magnesium: 2.2 mg/dL (ref 1.5–2.5)

## 2023-03-09 LAB — TSH: TSH: 3.01 mIU/L (ref 0.40–4.50)

## 2023-03-09 LAB — VITAMIN D 25 HYDROXY (VIT D DEFICIENCY, FRACTURES): Vit D, 25-Hydroxy: 35 ng/mL (ref 30–100)

## 2023-03-09 LAB — HEMOGLOBIN A1C
Hgb A1c MFr Bld: 5.1 %{Hb} (ref <5.7)
Mean Plasma Glucose: 100 mg/dL
eAG (mmol/L): 5.5 mmol/L

## 2023-03-09 LAB — MICROSCOPIC MESSAGE

## 2023-03-09 LAB — MICROALBUMIN / CREATININE URINE RATIO
Creatinine, Urine: 110 mg/dL (ref 20–320)
Microalb Creat Ratio: 45 mg/g{creat} — ABNORMAL HIGH (ref <30)
Microalb, Ur: 4.9 mg/dL

## 2023-03-09 LAB — PSA: PSA: 1.32 ng/mL (ref <=4.00)

## 2023-03-09 LAB — INSULIN, RANDOM: Insulin: 13.9 u[IU]/mL

## 2023-03-09 LAB — TESTOSTERONE: Testosterone: 274 ng/dL (ref 250–827)

## 2023-03-09 NOTE — Progress Notes (Signed)
^<^<^<^<^<^<^<^<^<^<^<^<^<^<^<^<^<^<^<^<^<^<^<^<^<^<^<^<^<^<^<^<^<^<^<^<^ ^>^>^>^>^>^>^>^>^>^>^>>^>^>^>^>^>^>^>^>^>^>^>^>^>^>^>^>^>^>^>^>^>^>^>^>^>  -Test results slightly outside the reference range are not unusual. If there is anything important, I will review this with you,  otherwise it is considered normal test values.  If you have further questions,  please do not hesitate to contact me at the office or via My Chart.   ^<^<^<^<^<^<^<^<^<^<^<^<^<^<^<^<^<^<^<^<^<^<^<^<^<^<^<^<^<^<^<^<^<^<^<^<^ ^>^>^>^>^>^>^>^>^>^>^>^>^>^>^>^>^>^>^>^>^>^>^>^>^>^>^>^>^>^>^>^>^>^>^>^>^  -   - Total Chol =   235  is very high risk for Heart Attack /Stroke /Vascular Dementia     ( Ideal or Goal is less than 180 ! )  & - Bad /Dangerous LDL Chol = 156   - - >> Sitting on a time Bomb !     ( Ideal or Goal is less than 70 ! )   - Have you stopped your Chol meds  ? ? ?    - The cause is Bad Diet !  - But need to go ahead & Re- start meds until get on a better diet to try &                                                        reverse some of the Damage already done   - Read or listen to   Dr Gerri Spore 's book    " How Not to Die ! "    - Recommend a stricter plant based low cholesterol diet   - Cholesterol only comes from animal sources                                                                  - ie. meat, dairy, egg yolks  - Eat all the vegetables you want.  - Avoid Meat, Avoid Meat , Avoid Meat  ! ! !                                                   -especially red meat - Beef AND Pork  - Avoid cheese & dairy - milk & ice cream.   - Cheese is the most concentrated form of trans-fats which                                                     is the worst thing to clog up our arteries.   - Veggie cheese is OK which can be found in                                              the fresh produce section at  [employer] or [employer] or [employer]  ^<^<^<^<^<^<^<^<^<^<^<^<^<^<^<^<^<^<^<^<^<^<^<^<^<^<^<^<^<^<^<^<^<^<^<^<^ ^>^>^>^>^>^>^>^>^>^>^>^>^>^>^>^>^>^>^>^>^>^>^>^>^>^>^>^>^>^>^>^>^>^>^>^>^  -  Iron studies Normal & OK   -But   -  Vitamin B12 =362    is   Very   Low  (Ideal or Goal Vit B12 is between 450 - 1,100)   Low Vit B12 may be associated with Anemia, Fatigue,   Peripheral Neuropathy, Dementia, "Brain Fog" & Depression  - Recommend take a sub-lingual form of Vitamin B12 tablet   1,000 to 5,000 mcg tab that you dissolve under your tongue /Daily   - Can get Lavonia Dana - best price at [employer] or on [employer]  ^<^<^<^<^<^<^<^<^<^<^<^<^<^<^<^<^<^<^<^<^<^<^<^<^<^<^<^<^<^<^<^<^<^<^<^<^ ^>^>^>^>^>^>^>^>^>^>^>^>^>^>^>^>^>^>^>^>^>^>^>^>^>^>^>^>^>^>^>^>^>^>^>^>^  -  PSA is very low - No sign of Prostate cancer - Great !   ^<^<^<^<^<^<^<^<^<^<^<^<^<^<^<^<^<^<^<^<^<^<^<^<^<^<^<^<^<^<^<^<^<^<^<^<^ ^>^>^>^>^>^>^>^>^>^>^>^>^>^>^>^>^>^>^>^>^>^>^>^>^>^>^>^>^>^>^>^>^>^>^>^>^  -  Testosterone - Normal range - Please continue Zinc supplements  ^<^<^<^<^<^<^<^<^<^<^<^<^<^<^<^<^<^<^<^<^<^<^<^<^<^<^<^<^<^<^<^<^<^<^<^<^ ^>^>^>^>^>^>^>^>^>^>^>^>^>^>^>^>^>^>^>^>^>^>^>^>^>^>^>^>^>^>^>^>^>^>^>^>^  -  A1c - Normal - No Diabetes  - Great !  ^<^<^<^<^<^<^<^<^<^<^<^<^<^<^<^<^<^<^<^<^<^<^<^<^<^<^<^<^<^<^<^<^<^<^<^<^ ^>^>^>^>^>^>^>^>^>^>^>^>^>^>^>^>^>^>^>^>^>^>^>^>^>^>^>^>^>^>^>^>^>^>^>^>^  -  Vitamin D = 35 - is Extremely low   -  Vitamin D goal is between 70-100.   -   Please make sure that you are taking your                                                                  Vitamin D 10,000 units /day as Directed.   - It is very important as a natural anti-inflammatory and helping the                           immune system protect against viral infections, like the Covid-19    helping hair, skin, and nails, as well as reducing stroke and heart attack risk.   - It helps your  bones and helps with mood.  - It also decreases numerous cancer risks so please                                                                                           take it as directed.   - Low Vit D is associated with a 200-300% higher risk for CANCER   and 200-300% higher risk for HEART   ATTACK  &  STROKE.    - It is also associated with higher death rate at younger ages,   autoimmune diseases like Rheumatoid arthritis, Lupus, Multiple Sclerosis.     - Also many other serious conditions, like Depression, Alzheimer's Dementia,    muscle aches, fatigue, fibromyalgia   ^<^<^<^<^<^<^<^<^<^<^<^<^<^<^<^<^<^<^<^<^<^<^<^<^<^<^<^<^<^<^<^<^<^<^<^<^ ^>^>^>^>^>^>^>^>^>^>^>^>^>^>^>^>^>^>^>^>^>^>^>^>^>^>^>^>^>^>^>^>^>^>^>^>^

## 2023-04-19 ENCOUNTER — Other Ambulatory Visit: Payer: Self-pay

## 2023-04-19 MED ORDER — COLCHICINE 0.6 MG PO TABS: ORAL_TABLET | ORAL | 2 refills | Status: AC

## 2023-06-15 ENCOUNTER — Ambulatory Visit: Payer: BC Managed Care – PPO | Admitting: Nurse Practitioner

## 2023-07-15 ENCOUNTER — Ambulatory Visit: Payer: BC Managed Care – PPO | Admitting: Nurse Practitioner

## 2023-07-20 ENCOUNTER — Ambulatory Visit (INDEPENDENT_AMBULATORY_CARE_PROVIDER_SITE_OTHER): Payer: BC Managed Care – PPO | Admitting: Nurse Practitioner

## 2023-07-20 ENCOUNTER — Encounter: Payer: Self-pay | Admitting: Nurse Practitioner

## 2023-07-20 VITALS — BP 138/96 | HR 101 | Temp 98.0°F | Ht 66.0 in | Wt 204.2 lb

## 2023-07-20 DIAGNOSIS — R0989 Other specified symptoms and signs involving the circulatory and respiratory systems: Secondary | ICD-10-CM

## 2023-07-20 DIAGNOSIS — K219 Gastro-esophageal reflux disease without esophagitis: Secondary | ICD-10-CM

## 2023-07-20 DIAGNOSIS — Z79899 Other long term (current) drug therapy: Secondary | ICD-10-CM

## 2023-07-20 DIAGNOSIS — R7309 Other abnormal glucose: Secondary | ICD-10-CM

## 2023-07-20 DIAGNOSIS — N521 Erectile dysfunction due to diseases classified elsewhere: Secondary | ICD-10-CM

## 2023-07-20 DIAGNOSIS — E559 Vitamin D deficiency, unspecified: Secondary | ICD-10-CM

## 2023-07-20 DIAGNOSIS — E66811 Obesity, class 1: Secondary | ICD-10-CM | POA: Diagnosis not present

## 2023-07-20 DIAGNOSIS — E782 Mixed hyperlipidemia: Secondary | ICD-10-CM | POA: Diagnosis not present

## 2023-07-20 DIAGNOSIS — Z720 Tobacco use: Secondary | ICD-10-CM

## 2023-07-20 DIAGNOSIS — E88819 Insulin resistance, unspecified: Secondary | ICD-10-CM

## 2023-07-20 NOTE — Progress Notes (Signed)
 FOLLOW UP  Assessment and Plan:   Hypertension Elevated in clinic last two visits - White Coat Syndrome versus Essential HTN. Discussed starting medication to lower BP if in home readings are >130/80. Patient to send MyChart Mssg over the next week of readings Intention to start medication if remains elevated.  Discussed DASH (Dietary Approaches to Stop Hypertension) DASH diet is lower in sodium than a typical American diet. Cut back on foods that are high in saturated fat, cholesterol, and trans fats. Eat more whole-grain foods, fish, poultry, and nuts Remain active and exercise as tolerated daily.  Check CMP/CBC  Cholesterol Elevated - Continue Rosuvastatin   Discussed lifestyle modifications. Recommended diet heavy in fruits and veggies, omega 3's. Decrease consumption of animal meats, cheeses, and dairy products. Remain active and exercise as tolerated. Continue to monitor. Check lipids  Obesity with co morbidities Discussed appropriate BMI Diet modification. Physical activity. Encouraged/praised to build confidence.  Vitamin D  deficiency Continue supplement for goal of 60-100 Monitor Vitamin D  levels  Chewing tobacco use Not ready to quit Tobacco cessation instruction/counseling given:  counseled patient on the dangers of tobacco use, advised patient to stop smoking, and reviewed strategies to maximize success Discussed risks associated with tobacco use and advised to reduce or quit  GERD No suspected reflux complications (Barret/stricture). Lifestyle modification:  wt loss, avoid meals 2-3h before bedtime. Consider eliminating food triggers:  chocolate, caffeine, EtOH, acid/spicy food.  Abnormal glucose/insulin  resistance Education: Reviewed 'ABCs' of diabetes management  Discussed goals to be met and/or maintained include A1C (<7) Blood pressure (<130/80) Cholesterol (LDL <70) Continue Eye Exam yearly  Continue Dental Exam Q6 mo Discussed dietary  recommendations Discussed Physical Activity recommendations Check A1C  Medication Management All medications discussed and reviewed in full. All questions and concerns regarding medications addressed.    Erectile Dysfunction Start Cialis  as directed SE of  medications discussed.  Orders Placed This Encounter  Procedures   CBC with Differential/Platelet   COMPLETE METABOLIC PANEL WITH GFR   Lipid panel   Hemoglobin A1c   Notify office for further evaluation and treatment, questions or concerns if any reported s/s fail to improve.   The patient was advised to call back or seek an in-person evaluation if any symptoms worsen or if the condition fails to improve as anticipated.   Further disposition pending results of labs. Discussed med's effects and SE's.    I discussed the assessment and treatment plan with the patient. The patient was provided an opportunity to ask questions and all were answered. The patient agreed with the plan and demonstrated an understanding of the instructions.  Discussed med's effects and SE's. Screening labs and tests as requested with regular follow-up as recommended.  I provided 30 minutes of face-to-face time during this encounter including counseling, chart review, and critical decision making was preformed.  Today's Plan of Care is based on a patient-centered health care approach known as shared decision making - the decisions, tests and treatments allow for patient preferences and values to be balanced with clinical evidence.      Future Appointments  Date Time Provider Department Center  09/13/2023 10:30 AM Tonita Fallow, MD [facility] None  03/20/2024 11:00 AM Tonita Fallow, MD [facility] None    ----------------------------------------------------------------------------------------------------------------------  HPI 53 y.o. male  presents for 6 month follow up on hypertension, cholesterol, glucose management, obesity, testosterone  def  and vitamin D  deficiency.   Overall he reports doing well today.    States that has started to take capsaicin and feels  as though it has helped with back pain.    BMI is Body mass index is 32.96 kg/m., he has been working on diet and exercise. Wt Readings from Last 3 Encounters:  07/20/23 204 lb 3.2 oz (92.6 kg)  03/08/23 199 lb 6.4 oz (90.4 kg)  12/30/22 196 lb 9.6 oz (89.2 kg)   Today their BP is BP: (!) 138/96  BP has been elevated in clinic over the last two visits.  He has purchased BP cuff but does not take at home.  He is not currently on BP medication. Of note weight did increased over the last 6 months.  He does not workout. He denies chest pain, shortness of breath, dizziness.   He is on cholesterol medication - he is on rosuvastatin  20 mg daily, and denies myalgias. Shares that he was not taking as directed prior to last blood work but has since remained compliant.  His cholesterol is not at goal. The cholesterol last visit was:   Lab Results  Component Value Date   CHOL 235 (H) 03/08/2023   HDL 59 03/08/2023   LDLCALC 156 (H) 03/08/2023   TRIG 91 03/08/2023   CHOLHDL 4.0 03/08/2023    He has been working on diet and exercise for glucose management, and denies increased appetite, nausea, paresthesia of the feet, polydipsia, polyuria, visual disturbances and vomiting. Last A1C in the office was:  Lab Results  Component Value Date   HGBA1C 5.1 03/08/2023   Patient is on Vitamin D  supplement, taking 10000 IU daily:     Lab Results  Component Value Date   VD25OH 35 03/08/2023     He has testosterone  def, he is currently taking zinc  50 mg daily, stopped shots and didn't notice a difference.  Lab Results  Component Value Date   TESTOSTERONE  274 03/08/2023   He continues on daily B12 supplement, doing well with SL  Lab Results  Component Value Date   VITAMINB12 362 03/08/2023   He continues to chew tobacco - not interested in stopping.    Current Medications:   Current Outpatient Medications on File Prior to Visit  Medication Sig   Ascorbic Acid (VITA-C PO) Take 1 tablet by mouth daily.    aspirin  81 MG tablet Take 81 mg by mouth at bedtime.    Cholecalciferol  (VITAMIN D ) 2000 units CAPS Take 5 capsules by mouth daily.    colchicine  0.6 MG tablet Take 2 tabs with onset of gout flare, then 1 tab daily as needed.   Cyanocobalamin  (VITAMIN B-12 SL) Place 1 tablet under the tongue at bedtime.    Magnesium  200 MG TABS Take 1 tablet by mouth at bedtime.    PARoxetine  (PAXIL ) 20 MG tablet Take 1/2 to 1 tablet  Daily  for Mood   rosuvastatin  (CRESTOR ) 20 MG tablet Take 1 tablet Daily for Cholesterol   tadalafil  (CIALIS ) 10 MG tablet Take 1 tablet (10 mg total) by mouth as needed for erectile dysfunction.   zinc  gluconate 50 MG tablet Take 50 mg by mouth at bedtime.   No current facility-administered medications on file prior to visit.     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  HENT:  Negative for hearing loss and tinnitus.   Eyes:  Negative for blurred vision and double vision.  Respiratory:  Negative for cough, shortness of breath and wheezing.   Cardiovascular:  Negative for chest pain, palpitations, orthopnea, claudication and leg swelling.  Gastrointestinal:  Negative for abdominal pain, blood in stool, constipation, diarrhea, heartburn, melena, nausea and vomiting.  Genitourinary: Negative.   Musculoskeletal:  Negative for joint pain and myalgias.  Skin:  Negative for rash.  Neurological:  Negative for dizziness, tingling, sensory change, weakness and headaches.   Endo/Heme/Allergies:  Negative for polydipsia.  Psychiatric/Behavioral: Negative.    All other systems reviewed and are negative.     Physical Exam: BP (!) 138/96   Pulse (!) 101   Temp 98 F (36.7 C)   Ht 5' 6 (1.676 m)   Wt 204 lb 3.2 oz (92.6 kg)   SpO2 99%   BMI 32.96 kg/m  Wt Readings from Last 3 Encounters:  07/20/23 204 lb 3.2 oz (92.6 kg)  03/08/23 199 lb 6.4 oz (90.4 kg)  12/30/22 196 lb 9.6 oz (89.2 kg)   General Appearance: Well nourished, in no apparent distress. Eyes: PERRLA, EOMs, conjunctiva no swelling or erythema Sinuses: No Frontal/maxillary tenderness ENT/Mouth: Ext aud canals clear, TMs without erythema, bulging. No erythema, swelling, or exudate on post pharynx.  Tonsils not swollen or erythematous. Hearing normal.  Neck: Supple, thyroid  normal.  Respiratory: Respiratory effort normal, BS equal bilaterally without rales, rhonchi, wheezing or stridor.  Cardio: RRR with no MRGs. Brisk peripheral pulses without edema.  Abdomen: Soft, obese abdomen, + BS.  Non tender, no guarding, rebound, hernias, masses. Lymphatics: Non tender without lymphadenopathy.  Musculoskeletal: Full ROM, 5/5 strength, Normal gait. R toe MTP joint with mild bony enlargement and erythema, no heat or bogginess.  Skin: Warm, dry without rashes, lesions, ecchymosis.  Neuro: Cranial nerves intact. No cerebellar symptoms.  Psych: Awake and oriented X 3, normal affect, Insight and Judgment appropriate.    BASCOM NECESSARY, NP 11:20 AM [facility]

## 2023-07-20 NOTE — Patient Instructions (Signed)
 Blood Pressure Record Sheet To take your blood pressure, you will need a blood pressure machine. You may be prescribed one, or you can buy a blood pressure machine (blood pressure monitor) at your clinic, drug store, or online. When choosing one, look for these features: An automatic monitor that has an arm cuff. A cuff that wraps snugly, but not too tightly, around your upper arm. You should be able to fit only one finger between your arm and the cuff. A device that stores blood pressure reading results. Do not choose a monitor that measures your blood pressure from your wrist or finger. Follow your health care provider's instructions for how to take your blood pressure. To use this form: Get one reading in the morning (a.m.) before you take any medicines. Get one reading in the evening (p.m.) before supper. Take at least two readings with each blood pressure check. This makes sure the results are correct. Wait 1-2 minutes between measurements. Write down the results in the spaces on this form. Repeat this once a week, or as told by your health care provider. Make a follow-up appointment with your health care provider to discuss the results. Blood pressure log Date: _______________________ a.m. _____________________(1st reading) _____________________(2nd reading) p.m. _____________________(1st reading) _____________________(2nd reading) Date: _______________________ a.m. _____________________(1st reading) _____________________(2nd reading) p.m. _____________________(1st reading) _____________________(2nd reading) Date: _______________________ a.m. _____________________(1st reading) _____________________(2nd reading) p.m. _____________________(1st reading) _____________________(2nd reading) Date: _______________________ a.m. _____________________(1st reading) _____________________(2nd reading) p.m. _____________________(1st reading) _____________________(2nd reading) Date:  _______________________ a.m. _____________________(1st reading) _____________________(2nd reading) p.m. _____________________(1st reading) _____________________(2nd reading) This information is not intended to replace advice given to you by your health care provider. Make sure you discuss any questions you have with your health care provider. Document Revised: 03/13/2021 Document Reviewed: 03/13/2021 Elsevier Patient Education  2024 [employer].

## 2023-07-21 LAB — CBC WITH DIFFERENTIAL/PLATELET
Absolute Lymphocytes: 1248 {cells}/uL (ref 850–3900)
Absolute Monocytes: 504 {cells}/uL (ref 200–950)
Basophils Absolute: 42 {cells}/uL (ref 0–200)
Basophils Relative: 0.7 %
Eosinophils Absolute: 210 {cells}/uL (ref 15–500)
Eosinophils Relative: 3.5 %
HCT: 49.2 % (ref 38.5–50.0)
Hemoglobin: 16.8 g/dL (ref 13.2–17.1)
MCH: 33.7 pg — ABNORMAL HIGH (ref 27.0–33.0)
MCHC: 34.1 g/dL (ref 32.0–36.0)
MCV: 98.8 fL (ref 80.0–100.0)
MPV: 10.3 fL (ref 7.5–12.5)
Monocytes Relative: 8.4 %
Neutro Abs: 3996 {cells}/uL (ref 1500–7800)
Neutrophils Relative %: 66.6 %
Platelets: 255 10*3/uL (ref 140–400)
RBC: 4.98 10*6/uL (ref 4.20–5.80)
RDW: 13.3 % (ref 11.0–15.0)
Total Lymphocyte: 20.8 %
WBC: 6 10*3/uL (ref 3.8–10.8)

## 2023-07-21 LAB — COMPLETE METABOLIC PANEL WITH GFR
AG Ratio: 1.6 (calc) (ref 1.0–2.5)
ALT: 42 U/L (ref 9–46)
AST: 32 U/L (ref 10–35)
Albumin: 4.9 g/dL (ref 3.6–5.1)
Alkaline phosphatase (APISO): 132 U/L (ref 35–144)
BUN: 14 mg/dL (ref 7–25)
CO2: 29 mmol/L (ref 20–32)
Calcium: 9.9 mg/dL (ref 8.6–10.3)
Chloride: 102 mmol/L (ref 98–110)
Creat: 0.83 mg/dL (ref 0.70–1.30)
Globulin: 3.1 g/dL (ref 1.9–3.7)
Glucose, Bld: 86 mg/dL (ref 65–99)
Potassium: 4.7 mmol/L (ref 3.5–5.3)
Sodium: 142 mmol/L (ref 135–146)
Total Bilirubin: 1 mg/dL (ref 0.2–1.2)
Total Protein: 8 g/dL (ref 6.1–8.1)
eGFR: 105 mL/min/{1.73_m2} (ref >=60)

## 2023-07-21 LAB — HEMOGLOBIN A1C
Hgb A1c MFr Bld: 5.1 %{Hb} (ref <5.7)
Mean Plasma Glucose: 100 mg/dL
eAG (mmol/L): 5.5 mmol/L

## 2023-07-21 LAB — LIPID PANEL
Cholesterol: 173 mg/dL (ref <200)
HDL: 62 mg/dL (ref >=40)
LDL Cholesterol (Calc): 93 mg/dL
Non-HDL Cholesterol (Calc): 111 mg/dL (ref <130)
Total CHOL/HDL Ratio: 2.8 (calc) (ref <5.0)
Triglycerides: 90 mg/dL (ref <150)

## 2023-07-23 ENCOUNTER — Encounter: Payer: Self-pay | Admitting: Nurse Practitioner

## 2023-07-26 MED ORDER — LISINOPRIL 10 MG PO TABS: ORAL_TABLET | ORAL | 3 refills | Status: AC

## 2023-09-08 ENCOUNTER — Other Ambulatory Visit: Payer: Self-pay

## 2023-09-08 MED ORDER — PAROXETINE HCL 20 MG PO TABS: ORAL_TABLET | ORAL | 0 refills | Status: AC

## 2023-09-13 ENCOUNTER — Ambulatory Visit: Payer: BC Managed Care – PPO | Admitting: Internal Medicine

## 2023-10-18 ENCOUNTER — Ambulatory Visit: Payer: BC Managed Care – PPO | Admitting: Family Medicine

## 2023-10-18 ENCOUNTER — Encounter: Payer: Self-pay | Admitting: Family Medicine

## 2023-10-18 VITALS — BP 110/80 | HR 103 | Temp 98.4°F | Ht 66.0 in | Wt 200.0 lb

## 2023-10-18 DIAGNOSIS — I10 Essential (primary) hypertension: Secondary | ICD-10-CM

## 2023-10-18 DIAGNOSIS — Z7689 Persons encountering health services in other specified circumstances: Secondary | ICD-10-CM

## 2023-10-18 DIAGNOSIS — E782 Mixed hyperlipidemia: Secondary | ICD-10-CM | POA: Diagnosis not present

## 2023-10-18 DIAGNOSIS — M109 Gout, unspecified: Secondary | ICD-10-CM

## 2023-10-18 DIAGNOSIS — N521 Erectile dysfunction due to diseases classified elsewhere: Secondary | ICD-10-CM | POA: Diagnosis not present

## 2023-10-18 NOTE — Patient Instructions (Addendum)
-  It was nice to meet you and look forward to taking care of you.  -Continue prescribed medications.  -Follow up in 3 months for chronic management and please be fasting for lab.

## 2023-10-18 NOTE — Assessment & Plan Note (Addendum)
 Stable. Continue Rosuvastatin 20mg  daily. Lipids in January were stable. Will check in 3 months.

## 2023-10-18 NOTE — Assessment & Plan Note (Signed)
 Stable. Continue Lisinopril 10mg  daily. Kidney function was stable in January. Will check CMP at his 3 month chronic management.

## 2023-10-18 NOTE — Progress Notes (Signed)
 New Patient Office Visit  Subjective   Patient ID: Jason Curry, male    DOB: 1971-01-09  Age: 53 y.o. MRN: [id number]  CC:  Chief Complaint  Patient presents with   Establish Care    HPI Jason Curry presents to establish care with new provider.   Patients previous primary care provider was [facility] with Dr. Lucky Cowboy. Last visit was 07/20/2023.   Specialist: None   HTN: Chronic. Patient is taking Lisinopril 10mg  daily. Patient reports he does monitor his blood pressure at home about 1-2 a week. Ranges: 120-146/80s-94. Denies CP, SHOB, HA, dizziness, lightheadedness, or lower extremity edema. BP Readings from Last 3 Encounters:  10/18/23 110/80  07/20/23 (!) 138/96  03/08/23 (!) 144/90    Hyperlipidemia: Chronic. Patient is taking Rosuvastatin 20mg  daily. Denies muscle aches, abd pain, nausea, or vomiting.  Lab Results  Component Value Date   CHOL 173 07/20/2023   HDL 62 07/20/2023   LDLCALC 93 07/20/2023   TRIG 90 07/20/2023   CHOLHDL 2.8 07/20/2023    Erectile Dysfunction: Chronic. Patient is taking Tadalafil 10mg  PRN. He is taking Paroxetine 20mg  tablet  daily for premature ejaculation.   Gout: Chronic. Patient is prescribed Colchicine 0.6mg  tablet, take 2 tablets with onset of gout flare, 1 tablet as needed. Last flare up was a month ago and before that was a year ago.    Outpatient Encounter Medications as of 10/18/2023  Medication Sig   Ascorbic Acid (VITA-C PO) Take 1 tablet by mouth daily.    aspirin 81 MG tablet Take 81 mg by mouth at bedtime.    Cholecalciferol (VITAMIN D) 2000 units CAPS Take 5 capsules by mouth daily.    colchicine 0.6 MG tablet Take 2 tabs with onset of gout flare, then 1 tab daily as needed.   Cyanocobalamin (VITAMIN B-12 SL) Place 1 tablet under the tongue at bedtime.    lisinopril (ZESTRIL) 10 MG tablet Take 1 tablet daily for BP   Magnesium 200 MG TABS Take 1 tablet by mouth at  bedtime.    PARoxetine (PAXIL) 20 MG tablet Take 1/2 to 1 tablet  Daily  for Mood   rosuvastatin (CRESTOR) 20 MG tablet Take 1 tablet Daily for Cholesterol   tadalafil (CIALIS) 10 MG tablet Take 1 tablet (10 mg total) by mouth as needed for erectile dysfunction.   zinc gluconate 50 MG tablet Take 50 mg by mouth at bedtime.   No facility-administered encounter medications on file as of 10/18/2023.    Past Medical History:  Diagnosis Date   Diverticulitis 01/22/2019   Gout    Hematuria    Hyperlipidemia    Hypertension    Hypogonadism male    Testosterone deficiency 06/13/2018   Vitamin D deficiency     Past Surgical History:  Procedure Laterality Date   VASECTOMY      Family History  Problem Relation Age of Onset   Cancer Mother        thyroid/breast   Heart disease Father    Hypertension Father    Hyperlipidemia Father    Cancer Maternal Grandfather        Lung    Social History   Socioeconomic History   Marital status: Divorced    Spouse name: Not on file   Number of children: 2   Years of education: Not on file   Highest education level: Associate degree: occupational, Scientist, product/process development, or vocational program  Occupational History   Not  on file  Tobacco Use   Smoking status: Former    Current packs/day: 0.00    Average packs/day: 0.3 packs/day for 7.0 years (1.8 ttl pk-yrs)    Types: Cigarettes    Start date: 39    Quit date: 31    Years since quitting: 28.2   Smokeless tobacco: Current    Types: Chew  Vaping Use   Vaping status: Never Used  Substance and Sexual Activity   Alcohol use: Yes    Alcohol/week: 1.0 standard drink of alcohol    Types: 1 Standard drinks or equivalent per week    Comment: 2-3 beers a day   Drug use: No   Sexual activity: Yes    Birth control/protection: Surgical  Other Topics Concern   Not on file  Social History Narrative   Not on file   Social Drivers of Health   Financial Resource Strain: Low Risk  (10/17/2023)    Overall Financial Resource Strain (CARDIA)    Difficulty of Paying Living Expenses: Not hard at all  Food Insecurity: No Food Insecurity (10/17/2023)   Hunger Vital Sign    Worried About Running Out of Food in the Last Year: Never true    Ran Out of Food in the Last Year: Never true  Transportation Needs: No Transportation Needs (10/17/2023)   PRAPARE - Administrator, Civil Service (Medical): No    Lack of Transportation (Non-Medical): No  Physical Activity: Insufficiently Active (10/18/2023)   Exercise Vital Sign    Days of Exercise per Week: 5 days    Minutes of Exercise per Session: 20 min  Stress: No Stress Concern Present (10/17/2023)   [employer] of Occupational Health - Occupational Stress Questionnaire    Feeling of Stress : Not at all  Social Connections: Moderately Integrated (10/17/2023)   Social Connection and Isolation Panel [NHANES]    Frequency of Communication with Friends and Family: More than three times a week    Frequency of Social Gatherings with Friends and Family: Three times a week    Attends Religious Services: More than 4 times per year    Active Member of Clubs or Organizations: Yes    Attends Banker Meetings: More than 4 times per year    Marital Status: Divorced  Intimate Partner Violence: Not At Risk (10/18/2023)   Humiliation, Afraid, Rape, and Kick questionnaire    Fear of Current or Ex-Partner: No    Emotionally Abused: No    Physically Abused: No    Sexually Abused: No    ROS See HPI above    Objective  BP 110/80   Pulse (!) 103   Temp 98.4 F (36.9 C) (Oral)   Ht 5\' 6"  (1.676 m)   Wt 200 lb (90.7 kg)   SpO2 97%   BMI 32.28 kg/m   Physical Exam Vitals reviewed.  Constitutional:      General: He is not in acute distress.    Appearance: Normal appearance. He is obese. He is not ill-appearing, toxic-appearing or diaphoretic.  HENT:     Head: Normocephalic and atraumatic.  Eyes:     General:        Right eye:  No discharge.        Left eye: No discharge.     Conjunctiva/sclera: Conjunctivae normal.  Cardiovascular:     Rate and Rhythm: Normal rate and regular rhythm.     Heart sounds: Normal heart sounds. No murmur heard.    No friction  rub. No gallop.  Pulmonary:     Effort: Pulmonary effort is normal. No respiratory distress.     Breath sounds: Normal breath sounds.  Musculoskeletal:        General: Normal range of motion.     Right lower leg: No edema.     Left lower leg: No edema.  Skin:    General: Skin is warm and dry.  Neurological:     General: No focal deficit present.     Mental Status: He is alert and oriented to person, place, and time. Mental status is at baseline.  Psychiatric:        Mood and Affect: Mood normal.        Behavior: Behavior normal.        Thought Content: Thought content normal.        Judgment: Judgment normal.      Assessment & Plan:  Essential hypertension Assessment & Plan: Stable. Continue Lisinopril 10mg  daily. Kidney function was stable in January. Will check CMP at his 3 month chronic management.    Hyperlipidemia, mixed Assessment & Plan: Stable. Continue Rosuvastatin 20mg  daily. Lipids in January were stable. Will check in 3 months.    Erectile dysfunction due to diseases classified elsewhere Assessment & Plan: Stable. Continue Paroxetine 20mg  daily and Tadalafil 10mg  PRN.    Gout, unspecified cause, unspecified chronicity, unspecified site Assessment & Plan: Stable. Continue Colchicine as needed.   Encounter to establish care   1.Review health maintenance:  -Colonoscopy/cologuard: Declines  -Covid booster: Declines  -Zoster vaccine: Declines  -Hep C: Will obtain when labs are drawn.  2. Follow up in 3 months, then go to every 6 months if labs are stable and patient is doing well.  Return in about 3 months (around 01/17/2024) for chronic management-fasting .   Zandra Abts, NP

## 2023-10-18 NOTE — Assessment & Plan Note (Signed)
 Stable. Continue Paroxetine 20mg  daily and Tadalafil 10mg  PRN.

## 2023-10-18 NOTE — Assessment & Plan Note (Signed)
 Stable. Continue Colchicine as needed.

## 2023-11-09 ENCOUNTER — Ambulatory Visit: Payer: BC Managed Care – PPO | Admitting: Internal Medicine

## 2024-01-17 ENCOUNTER — Ambulatory Visit: Admitting: Family Medicine

## 2024-01-17 ENCOUNTER — Encounter: Payer: Self-pay | Admitting: Family Medicine

## 2024-01-17 ENCOUNTER — Ambulatory Visit: Payer: Self-pay | Admitting: Family Medicine

## 2024-01-17 VITALS — BP 124/80 | HR 97 | Temp 98.3°F | Ht 66.0 in | Wt 209.0 lb

## 2024-01-17 DIAGNOSIS — I10 Essential (primary) hypertension: Secondary | ICD-10-CM | POA: Diagnosis not present

## 2024-01-17 DIAGNOSIS — M109 Gout, unspecified: Secondary | ICD-10-CM | POA: Diagnosis not present

## 2024-01-17 DIAGNOSIS — R748 Abnormal levels of other serum enzymes: Secondary | ICD-10-CM

## 2024-01-17 DIAGNOSIS — N521 Erectile dysfunction due to diseases classified elsewhere: Secondary | ICD-10-CM | POA: Diagnosis not present

## 2024-01-17 DIAGNOSIS — E782 Mixed hyperlipidemia: Secondary | ICD-10-CM | POA: Diagnosis not present

## 2024-01-17 LAB — COMPREHENSIVE METABOLIC PANEL WITH GFR
ALT: 73 U/L — ABNORMAL HIGH (ref 0–53)
AST: 39 U/L — ABNORMAL HIGH (ref 0–37)
Albumin: 4.9 g/dL (ref 3.5–5.2)
Alkaline Phosphatase: 97 U/L (ref 39–117)
BUN: 12 mg/dL (ref 6–23)
CO2: 29 meq/L (ref 19–32)
Calcium: 9.9 mg/dL (ref 8.4–10.5)
Chloride: 101 meq/L (ref 96–112)
Creatinine, Ser: 0.83 mg/dL (ref 0.40–1.50)
GFR: 100.21 mL/min (ref >60.00)
Glucose, Bld: 89 mg/dL (ref 70–99)
Potassium: 4.4 meq/L (ref 3.5–5.1)
Sodium: 142 meq/L (ref 135–145)
Total Bilirubin: 0.9 mg/dL (ref 0.2–1.2)
Total Protein: 7.5 g/dL (ref 6.0–8.3)

## 2024-01-17 LAB — LIPID PANEL
Cholesterol: 264 mg/dL — ABNORMAL HIGH (ref 0–200)
HDL: 58.7 mg/dL (ref >39.00)
LDL Cholesterol: 184 mg/dL — ABNORMAL HIGH (ref 0–99)
NonHDL: 205.36
Total CHOL/HDL Ratio: 4
Triglycerides: 109 mg/dL (ref 0.0–149.0)
VLDL: 21.8 mg/dL (ref 0.0–40.0)

## 2024-01-17 LAB — CBC WITH DIFFERENTIAL/PLATELET
Basophils Absolute: 0 K/uL (ref 0.0–0.1)
Basophils Relative: 0.4 % (ref 0.0–3.0)
Eosinophils Absolute: 0.1 K/uL (ref 0.0–0.7)
Eosinophils Relative: 1.3 % (ref 0.0–5.0)
HCT: 46.7 % (ref 39.0–52.0)
Hemoglobin: 16.4 g/dL (ref 13.0–17.0)
Lymphocytes Relative: 14.9 % (ref 12.0–46.0)
Lymphs Abs: 1.2 K/uL (ref 0.7–4.0)
MCHC: 35.1 g/dL (ref 30.0–36.0)
MCV: 97.4 fl (ref 78.0–100.0)
Monocytes Absolute: 0.6 K/uL (ref 0.1–1.0)
Monocytes Relative: 7.2 % (ref 3.0–12.0)
Neutro Abs: 6 K/uL (ref 1.4–7.7)
Neutrophils Relative %: 76.2 % (ref 43.0–77.0)
Platelets: 234 K/uL (ref 150.0–400.0)
RBC: 4.79 Mil/uL (ref 4.22–5.81)
RDW: 13.9 % (ref 11.5–15.5)
WBC: 7.8 K/uL (ref 4.0–10.5)

## 2024-01-17 MED ORDER — TADALAFIL 10 MG PO TABS: 10.0000 mg | ORAL_TABLET | ORAL | 1 refills | Status: AC | PRN

## 2024-01-17 MED ORDER — ROSUVASTATIN CALCIUM 20 MG PO TABS
ORAL_TABLET | ORAL | 2 refills | Status: AC
Start: 2024-01-17 — End: ?

## 2024-01-17 NOTE — Patient Instructions (Addendum)
-  It was good to see you today.  -Continue all your medications. Refilled Rosuvastatin  and Tadalafil .  -Ordered labs. Office will call with lab results and will be available on MyChart. -Follow up in 6 months for a physical.

## 2024-01-17 NOTE — Progress Notes (Signed)
 Established Patient Office Visit   Subjective:  Patient ID: Jason Curry, male    DOB: 1970/08/30  Age: 54 y.o. MRN: [id number]  Chief Complaint  Patient presents with   Medical Management of Chronic Issues    3 month follow up     HPI HTN: Chronic. Patient is taking Lisinopril  10mg  daily. Patient reports he does monitor his blood pressure at home about 1-2 a week. Ranges: 120-146/80s-94. Denies CP, SHOB, HA, dizziness, lightheadedness, or lower extremity edema. BP Readings from Last 3 Encounters:  01/17/24 124/80  10/18/23 110/80  07/20/23 (!) 138/96    Hyperlipidemia: Chronic. Patient is taking Rosuvastatin  20mg  daily. Denies muscle aches, abd pain, nausea, or vomiting. He has been out of medication for about 1.5 weeks.  Lab Results  Component Value Date   CHOL 173 07/20/2023   HDL 62 07/20/2023   LDLCALC 93 07/20/2023   TRIG 90 07/20/2023   CHOLHDL 2.8 07/20/2023    Erectile Dysfunction: Chronic. Patient is taking Tadalafil  10mg  PRN. He is taking Paroxetine  20mg  tablet  daily for premature ejaculation.    Gout: Chronic. Patient is prescribed Colchicine  0.6mg  tablet, take 2 tablets with onset of gout flare, 1 tablet as needed. Last flare up was a while ago. ROS See HPI above     Objective:   BP 124/80   Pulse 97   Temp 98.3 F (36.8 C) (Oral)   Ht 5' 6 (1.676 m)   Wt 209 lb (94.8 kg)   SpO2 98%   BMI 33.73 kg/m    Physical Exam Vitals reviewed.  Constitutional:      General: He is not in acute distress.    Appearance: Normal appearance. He is obese. He is not ill-appearing, toxic-appearing or diaphoretic.  HENT:     Head: Normocephalic and atraumatic.  Eyes:     General:        Right eye: No discharge.        Left eye: No discharge.     Conjunctiva/sclera: Conjunctivae normal.  Cardiovascular:     Rate and Rhythm: Normal rate and regular rhythm.     Heart sounds: Normal heart sounds. No murmur heard.    No friction rub. No gallop.  Pulmonary:      Effort: Pulmonary effort is normal. No respiratory distress.     Breath sounds: Normal breath sounds.  Musculoskeletal:        General: Normal range of motion.  Skin:    General: Skin is warm and dry.  Neurological:     General: No focal deficit present.     Mental Status: He is alert and oriented to person, place, and time. Mental status is at baseline.  Psychiatric:        Mood and Affect: Mood normal.        Behavior: Behavior normal.        Thought Content: Thought content normal.        Judgment: Judgment normal.     Assessment & Plan:  Essential hypertension Assessment & Plan: Stable. Continue Lisinopril  10mg  daily. Ordered CBC and CMP.     Orders: -     CBC with Differential/Platelet -     Comprehensive metabolic panel with GFR  Hyperlipidemia, mixed Assessment & Plan: Stable. Continue Rosuvastatin  20mg  daily. Refilled medication. Ordered lipid panel and CMP.   Orders: -     Rosuvastatin  Calcium ; Take 1 tablet Daily for Cholesterol  Dispense: 90 tablet; Refill: 2 -     Comprehensive metabolic  panel with GFR -     Lipid panel  Erectile dysfunction due to diseases classified elsewhere Assessment & Plan: Stable. Continue Paroxetine  20mg  daily and Tadalafil  10mg  PRN. Refilled Tadalafil  10mg  PRN.     Orders: -     Tadalafil ; Take 1 tablet (10 mg total) by mouth as needed for erectile dysfunction.  Dispense: 20 tablet; Refill: 1  Gout, unspecified cause, unspecified chronicity, unspecified site Assessment & Plan: Stable. No recent gout flare ups. Continue Colchicine  as needed.      Return in about 6 months (around 07/19/2024) for physical.   Nishawn Rotan, NP

## 2024-01-17 NOTE — Assessment & Plan Note (Signed)
 Stable. Continue Paroxetine  20mg  daily and Tadalafil  10mg  PRN. Refilled Tadalafil  10mg  PRN.

## 2024-01-17 NOTE — Assessment & Plan Note (Signed)
 Stable. Continue Rosuvastatin  20mg  daily. Refilled medication. Ordered lipid panel and CMP.

## 2024-01-17 NOTE — Assessment & Plan Note (Signed)
 Stable. Continue Lisinopril  10mg  daily. Ordered CBC and CMP.

## 2024-01-17 NOTE — Assessment & Plan Note (Signed)
 Stable. No recent gout flare ups. Continue Colchicine  as needed.

## 2024-02-07 ENCOUNTER — Other Ambulatory Visit (INDEPENDENT_AMBULATORY_CARE_PROVIDER_SITE_OTHER)

## 2024-02-07 DIAGNOSIS — R748 Abnormal levels of other serum enzymes: Secondary | ICD-10-CM

## 2024-02-07 LAB — COMPREHENSIVE METABOLIC PANEL WITH GFR
ALT: 24 U/L (ref 0–53)
AST: 17 U/L (ref 0–37)
Albumin: 4.6 g/dL (ref 3.5–5.2)
Alkaline Phosphatase: 99 U/L (ref 39–117)
BUN: 10 mg/dL (ref 6–23)
CO2: 28 meq/L (ref 19–32)
Calcium: 9.2 mg/dL (ref 8.4–10.5)
Chloride: 105 meq/L (ref 96–112)
Creatinine, Ser: 0.77 mg/dL (ref 0.40–1.50)
GFR: 102.46 mL/min (ref >60.00)
Glucose, Bld: 87 mg/dL (ref 70–99)
Potassium: 3.7 meq/L (ref 3.5–5.1)
Sodium: 141 meq/L (ref 135–145)
Total Bilirubin: 0.7 mg/dL (ref 0.2–1.2)
Total Protein: 7.3 g/dL (ref 6.0–8.3)

## 2024-02-08 ENCOUNTER — Ambulatory Visit: Payer: Self-pay | Admitting: Family Medicine

## 2024-03-20 ENCOUNTER — Encounter: Payer: BC Managed Care – PPO | Admitting: Internal Medicine
